# Patient Record
Sex: Female | Born: 1990 | Race: White | Hispanic: No | Marital: Single | Smoking: Current every day smoker
Health system: Southern US, Community
[De-identification: ages and names within clinical notes are randomized; demographics above are authoritative.]

## PROBLEM LIST (undated history)

## (undated) DIAGNOSIS — F410 Panic disorder [episodic paroxysmal anxiety] without agoraphobia: Secondary | ICD-10-CM

## (undated) DIAGNOSIS — O139 Gestational [pregnancy-induced] hypertension without significant proteinuria, unspecified trimester: Secondary | ICD-10-CM

## (undated) DIAGNOSIS — F419 Anxiety disorder, unspecified: Secondary | ICD-10-CM

## (undated) DIAGNOSIS — R55 Syncope and collapse: Secondary | ICD-10-CM

## (undated) DIAGNOSIS — R519 Headache, unspecified: Secondary | ICD-10-CM

## (undated) DIAGNOSIS — S82853A Displaced trimalleolar fracture of unspecified lower leg, initial encounter for closed fracture: Secondary | ICD-10-CM

## (undated) DIAGNOSIS — G932 Benign intracranial hypertension: Secondary | ICD-10-CM

## (undated) DIAGNOSIS — F32A Depression, unspecified: Secondary | ICD-10-CM

## (undated) DIAGNOSIS — L732 Hidradenitis suppurativa: Secondary | ICD-10-CM

## (undated) DIAGNOSIS — O24419 Gestational diabetes mellitus in pregnancy, unspecified control: Secondary | ICD-10-CM

## (undated) DIAGNOSIS — B009 Herpesviral infection, unspecified: Secondary | ICD-10-CM

## (undated) HISTORY — DX: Depression, unspecified: F32.A

## (undated) HISTORY — DX: Anxiety disorder, unspecified: F41.9

## (undated) HISTORY — PX: LUMBAR PUNCTURE: SHX1985

## (undated) HISTORY — DX: Headache, unspecified: R51.9

## (undated) HISTORY — DX: Hidradenitis suppurativa: L73.2

---
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
## 2011-03-28 NOTE — Discharge Summary (Signed)
[name], [name]             ACCOUNT NO.:  [id number]   MEDICAL RECORD NO.:  [id number]          PATIENT TYPE:  INP   LOCATION:  6123                         FACILITY:  [facility]   PHYSICIAN:  Dyann Ruddle, MDDATE OF BIRTH:  29-Mar-1991   DATE OF ADMISSION:  06/26/2008  DATE OF DISCHARGE:  06/29/2008                               DISCHARGE SUMMARY   REASON FOR HOSPITALIZATION:  A 20 year old female with a history of  pseudotumor cerebri with mononucleosis and worsening abdominal pain.   SIGNIFICANT FINDINGS:  Included, CBC on admission that was 2.6, 11.4,  33.5, platelets were 100, neutrophils were 48%, lymphocytes were 41%,  and ANC was 1.2.  She had a PT, which was 16.2 seconds.  INR, which was  1.3 seconds.  A PTT which was 43 seconds.  She had a complete metabolic  panel, which was within normal limits except for AST of 52 and ALT of  45.  She had a urinalysis, which showed 30 proteins, trace leukocytes,  and 0-2 white blood cells per high-powered field.  Her urine pregnancy  test was negative.  She had a CT of the abdomen, which showed  splenomegaly with acute upper pole-shaped infarct.  No hemorrhage or  fluid.  She also had a CT of the pelvis, which showed no acute findings.  Repeat CBC was 2.5, 11.4, 33.3 with platelets of 109, neutrophils were  49%, and lymphocytes were 41%.  Of note [name], also known as [name],  had a slow improvement of her pain throughout her admission.  Though, it  is not completely resolved on discharge.  She developed a headache  during the course of her stay and was treated with scheduled  acetaminophen and rest.  She was given a dose of Lasix with moderate  improvement and headache pain.  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  A peripheral smear was also done  that did not reveal hematologic malignancy and LDH was modestly elevated  at  5:45.  A uric acid was normal at 5.1.  Trauma Surgery was consulted  at admission, but her H&H remained stable, as did her serial abdominal  exam.  It was felt that the patient was stable for discharge with a  repeat abdominal ultrasound for 1-2 months.   Her treatment course included IV morphine x2, Zofran, and IV Toradol.  She has been transitioned to oxycodone 5 mg p.o. q. 4-6 h. for pain and  was given acetaminophen as well.  She also received half maintenance IV  fluids during this hospitalization.   OPERATIONS AND PROCEDURES:  CT of the abdomen and pelvis and a chest x-  ray, which was normal.   FINAL DIAGNOSIS:  Splenic infarct  Splenomegaly  Mononucleosis   She will need a followup ultrasound in 1-2 months after discharge to  evaluate her splenomegaly.  She may not participate in contact sports  for 3 months and she is limited to light activity for 3 months.   DISCHARGE MEDICATIONS:  1. Tylenol 650  mg p.o. q.4-6 h. p.r.n. pain or fever.  2. Oxycodone 5 mg one tablet p.o. q.4-6 h. p.r.n. pain.  3. Zofran 4 mg p.o. 3 times a day p.r.n. nausea.   PENDING RESULTS:  Blood cultures which were drawn on June 27, 2008, at  12 p.m., nd H1N1 flu swab PCR and an antiphospholipid antibody.   FOLLOWUP:  Her follow up is with Dr. Elias Else at [facility] at  the [facility] and she is to follow up on July 02, 2008, at 12:15 p.m.   DISCHARGE WEIGHT:  88.6 kg.   DISCHARGE CONDITION:  Stable.   Addendum:  H1N1 not performed  Antiphopholipid antibody negative      Pediatrics Resident      Dyann Ruddle, MD  Electronically Signed    PR/MEDQ  D:  06/29/2008  T:  06/30/2008  Job:  [id number]   cc:   Molly Maduro A. Nicholos Johns, M.D.  Deanna Artis. Sharene Skeans, M.D.

## 2011-03-28 NOTE — Consult Note (Signed)
[name], [name]             ACCOUNT NO.:  [id number]   MEDICAL RECORD NO.:  [id number]          PATIENT TYPE:  OBV   LOCATION:  6123                         FACILITY:  [facility]   PHYSICIAN:  Anselm Pancoast. Weatherly, M.D.DATE OF BIRTH:  1991-02-15   DATE OF CONSULTATION:  06/26/2008  DATE OF DISCHARGE:                                 CONSULTATION   CHIEF COMPLAINT:  Abdominal pain.   HISTORY:  [name] is a 20 year old moderately overweight young  female who was diagnosed with mononucleosis approximately 2 days ago and  wanted the urgent cares.  She is not that well for about a week.  She  has had problems with nausea and vomiting.  I think she was seen, had  gotten muscular aching, generalized systemic symptoms, and she has had  numerous several episodes of very heavy vomiting, but not a lot of  emesis.  Because of increase in pain in the left upper quadrant, she  came to the emergency room today, was seen by the pediatrician, and the  first thing they did was a CT, and this shows kind of a wedge defect in  a markedly enlarged spleen.  There is no fluid outside of the spleen.  The liver is kind of unremarkable, and most likely, this is a kind of  minimal hematoma within this upper pole of an enlarged spleen.  The only  definite trauma that she has had, they have a cat or dog, that is about  10 pounds that did jump up on her yesterday, and she started having more  of these abdominal left upper quadrant pain last evening.   After the CT have been obtained, laboratory studies were obtained, the  white count is 2600 and hematocrit is 33.5.  Serum electrolytes are  normal.  SGOT and SGPT are only very minimally elevated in the 50s, and  her prothrombin time is about 16 seconds.  Her PTT is normal.   PHYSICAL EXAMINATION:  GENERAL:  She is a slightly overweight young  female Caucasian who looks like she does not feel great.  She is not  seriously ill.  VITAL SIGNS:  She did have  temperature of 100.4 on admission, her pulse  is 95.  She is resting comfortably.  ABDOMEN:  There is no evidence of any generalized abdominal pain.  You  can feel the lower edge of spleen about a hand width below the lower  costal margin, it is probably the spleen 4 times or more of normal size.  I think that the history of the trauma is not great, probably more  related to the retching.  I would expect in the dog jumping on her.  I  would think that she ought to be admitted for observation on the  Pediatric Service and treated her appropriately for the mononucleosis.   I have talked with the patient's mother and the patient, and reviewed  the CT with them.  I think that when she is most likely discharged in a  couple days if there is no drop in the hemoglobin and hematocrit and the  lab staying unremarkable,  that she certainly should not be by herself  for the next week or two.  It is about 2 weeks before school starts and  she of course will not be able to just play any type of contact sports,  etc., until things are back to normal.  I think the likelihood of her  having needed emergency surgery for ruptured spleen is low, and the  importance of stressing they follow no contact activities, etc., has  certainly been stressed upon both the patient and her mother.  Mother  does work and will need to make arrangements that she has an adult with  her at least for the first week when she is home and kind of has to find  how to put her on bed rest with basically bedside bathroom privileges  only at this time, and we will watch carefully.  She is going to be  admitted to the Pediatric Service, the trauma surgeons are  aware of her admission, and we will follow along.  She had been taking  Tylenol and ibuprofen.  I would not use the ibuprofen or Advil for the  discomfort, but fortunately she is not having that much discomfort now.  I am not sure of the etiology of this and also it is probably  related to  the mononucleosis.           ______________________________  Anselm Pancoast. Zachery Dakins, M.D.     WJW/MEDQ  D:  06/26/2008  T:  06/27/2008  Job:  [id number]

## 2011-03-31 NOTE — Op Note (Signed)
[name], West                         ACCOUNT NO.:  [id number]   MEDICAL RECORD NO.:  [id number]                   PATIENT TYPE:  OIB   LOCATION:  6152                                 FACILITY:  [facility]   PHYSICIAN:  Deanna Artis. Sharene Skeans, M.D.           DATE OF BIRTH:  02-10-1991   DATE OF PROCEDURE:  09/09/2002  DATE OF DISCHARGE:                                 OPERATIVE REPORT   PROCEDURE:  Lumbar puncture under anesthesia.   CLINICAL NOTE:  The patient is a 20 year old Caucasian young woman who has  had a history of headaches of seven weeks' duration.  The patient was seen  in my office after her physician, Dr. Merri West, had tried a variety of  treatments, which had not provided relief for her.   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  This is not proven by any other serologies.   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  There is a slight local mass effect  upon the cerebellum and also the bone, indicating that this has been a  chronic process and was not of great consequence.  In addition, the  patient's examination was normal with the exception of no venous pulsations  despite sharp disc margins and no obvious sign of papilledema.   The patient has been treated with Topamax 30 mg twice daily without relief.  She also has rescue medicines with naproxen and Darvocet that have not  provided relief for her.  She has been home and not able to attend school  for the last six weeks and is beginning to fall behind because she has no  interest even in doing her home work.   In this setting, lumbar puncture was done to look for the presence of  increased intracranial pressure and also to look for the presence of low-  grade inflammatory condition such as Lyme,  Mycoplasma, and [facility]  spotted fever.  These systemic serologies were negative, as was her  sedimentation rate.   The patient had been seen also by Dr. Kendell West, neurosurgeon at  [facility], who encouraged me to go ahead with a lumbar puncture.   PHYSICAL EXAMINATION:  VITAL SIGNS:  On examination today, blood pressure  124/62, resting pulse 112 (the patient was anxious), respirations 21.  The  patient was afebrile.  HEENT:  No signs of infection.  NECK:  Supple, full range of motion.  No cranial or cervical bruits.  CHEST:  Lungs clear to auscultation.  CARDIAC:  No murmurs.  Pulses normal.  ABDOMEN:  Soft, nontender, bowel sounds normal.  EXTREMITIES:  Well-formed, without edema, cyanosis, alterations in tone, or  tight heel cords.  NEUROLOGIC:  Mental status:  The patient was  awake and alert, unsmiling  although she would smile if I asked her to do so.  She was coherent,  conversant, able to follow commands and speak in complete sentences.  Cranial nerves:  Round, reactive pupils, normal fundi, full visual fields to  double simultaneous stimuli.  Extraocular movements full and conjugate.  OKN  responses equal bilaterally.  Again, she has sharp disc margins.  I did not  see venous pulsations.  Extraocular movements full and conjugate.  OKN  responses equal bilaterally.  Symmetric facial strength, midline tongue and  uvula.  Air conduction greater than bone conduction bilaterally.   Motor examination:  Normal strength, tone, and mass.  Good fine motor  movements.  No pronator drift.  Sensation intact to cold, vibration, and  stereognosis.  Cerebellar examination:  Good finger-to-nose, rapid  repetitive movements.  No tremor, dystaxia, dysmetria.  Gait and station was  normal.  She was able to walk on her heels and toes and perform a tandem  without difficulty.   DESCRIPTION OF PROCEDURE:  The procedure was carried out with the patient  intubated and given Propofol.  She  tolerated this well.  After sterilely  prepping the patient, I inserted a 20-gague three-inch spinal needle into  the subarachnoid space atraumatically.  Opening pressure was 242 mmH2O.  A  total of 23.5 cc of fluid was removed with a closing pressure of 150 mmH2O.  The patient tolerated this procedure well and was allowed to wake up from  the Propofol.   The fluid will be sent for glucose, protein, cell count, and serologies for  Lyme, Mycoplasma, and [facility] spotted fever by PCR if possible.  The  remainder of the fluid will be saved for one week in case there are  unforeseen abnormalities.  We will allow the patient to go home after  observation and will consider use of Diamox once we are certain that she is  not showing signs of post-lumbar puncture headache.                                               Deanna Artis. Sharene Skeans, M.D.    [facility]  D:  09/09/2002  T:  09/09/2002  Job:  [id number]   cc:   Dario Guardian, M.D.

## 2011-03-31 NOTE — Op Note (Signed)
[name], [name]                         ACCOUNT NO.:  [id number]   MEDICAL RECORD NO.:  [id number]                   PATIENT TYPE:  OIB   LOCATION:  NA                                   FACILITY:  [facility]   PHYSICIAN:  Deanna Artis. Sharene Skeans, M.D.           DATE OF BIRTH:  Mar 17, 1991   DATE OF PROCEDURE:  05/11/2004  DATE OF DISCHARGE:                                 OPERATIVE REPORT   INDICATIONS:  Pseudotumor cerebri, 348.2, without papilledema.   BRIEF HISTORY:  The patient has had chronic daily headaches.  She was seen  October 2003.  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  The patient.  The patient had dull, chronic daily headaches.  She was seen  by Dr. Chrissie Noa __________ of [facility], who agreed  with my findings.  Nonetheless, we performed a lumbar puncture under  anesthesia on two occasions, both of which showed evidence of increased  intracranial pressure.  The patient had an MRI of the brain, which showed a  normal venous angiogram.   The patient was treated with Diamox and did not obtain relief.  She was  switched to furosemide, which helped her.  She has been seen by her  ophthalmologist, Dr. Verne Carrow, and still no evidence of optic disc  edema despite the fact that she has chronic daily headaches, for which she  take furosemide 20 mg per day.   The patient again was examined in my office April 17, 2004, and was found to  have normal examination with no signs of papilledema, sharp disc margins,  visual acuity of 20/30 OD, 20/50 OS.   We had the patient seen by Dr. Jolayne Panther at [facility].  He recommended another lumbar puncture.  This was  carried out and showed an opening pressure of 480 mmH2O.  The patient had 64  mL of spinal fluid removed, which dropped her pressure down to 240 mmH2O.  She tolerated  the procedure well.  The procedure was carried out under  anesthesia.   PLAN:  The patient will recover in PACU and be discharged from there.  She  is to be on bed rest today and to get upright only to eat, void, and  eliminate.  She should drink liberal amounts of caffeinated fluids.  She  should contact my office if she gets a severe holocephalic headache upon  standing.                                               Deanna Artis. Sharene Skeans, M.D.    [facility]  D:  05/11/2004  T:  05/11/2004  Job:  [id number]   cc:  Jolayne Panther, M.D.  Dept. of Neurology, Div. Child Neurology  [facility]. [employer].  [facility]  [place], [place] [address]   Pasty Spillers. Maple Hudson, M.D.  [address]  [place]  [place] [address]  Fax: [phone]

## 2011-03-31 NOTE — Op Note (Signed)
   NAMELANIER, [name]                         ACCOUNT NO.:  [id number]   MEDICAL RECORD NO.:  [id number]                   PATIENT TYPE:  OIB   LOCATION:  2899                                 FACILITY:  [facility]   PHYSICIAN:  Deanna Artis. Sharene Skeans, M.D.           DATE OF BIRTH:  1991/05/29   DATE OF PROCEDURE:  10/14/2002  DATE OF DISCHARGE:                                 OPERATIVE REPORT   INDICATIONS:  Pseudotumor cerebri without papilledema, 348.3 and 277.0.   DESCRIPTION OF PROCEDURE:  After the patient had been placed under  anesthesia with Diprivan, the patient was sterilely prepped and draped and  local anesthesia was not placed.  The needle was inserted in the L3-4  interspace at an acute angle and on the second pass, the interspace was  entered.  Opening pressure was 300 mmH2O.  A total of 23.5 cc was obtained  of clear, colorless fluid.  The initial fluid was slightly tinged with blood  but cleared quickly.  Closing pressure was 160 mmH2O.  The patient tolerated  the procedure well.  A decision will need to be made concerning modification  of her treatment given the presence of ongoing elevated intracranial  pressure.                                                 Deanna Artis. Sharene Skeans, M.D.    [facility]  D:  10/14/2002  T:  10/14/2002  Job:  [id number]

## 2011-03-31 NOTE — Discharge Summary (Signed)
[name], [name]                         ACCOUNT NO.:  [id number]   MEDICAL RECORD NO.:  [id number]                   PATIENT TYPE:  OIB   LOCATION:  2899                                 FACILITY:  [facility]   PHYSICIAN:  Deanna Artis. Sharene Skeans, M.D.           DATE OF BIRTH:  1991-10-21   DATE OF ADMISSION:  10/14/2002  DATE OF DISCHARGE:  10/14/2002                                 DISCHARGE SUMMARY   HISTORY OF PRESENT ILLNESS:  The patient is a 20 year old right-handed  Caucasian girl with a diagnosis of pseudotumor cerebri made by lumbar  puncture under anesthesia September 09, 2002, (opening pressure 242).  The  patient has been treated with Dio mox. Dose has been escalated from 250 mg  twice a day to 250 mg four times a day.  Headache is improved but has not  been abolished.   The patient was brought in today to repeat the lumbar puncture to determine  the efficacy of Dio mox given the fact that she has no papilledema by my  examination or by ophthalmology.   REVIEW OF SYSTEMS:  This is positive for an upper respiratory infection,  sore throat.  No fever, rash or anemia.  No other intercurrent infection.  The patient has had some joint pain with a history of JRA.  She wears  glasses.  She has had ongoing headaches in ways which has made it difficult  for her to go to school.  No neurological signs or symptoms.   MEDICATION:  Dio mox 250 mg q.i.d.   ALLERGIES:  No known drug allergies.   FAMILY HISTORY:  This is positive for coronary artery bypass graft in her  father who died of atherosclerotic cardiovascular disease.  Maternal  grandmother has hypertension.   SOCIAL HISTORY:  The patient is in fifth grade.  She has been able to return  to school.  She does not have outside interests.   PHYSICAL EXAMINATION:  VITAL SIGNS:  Temperature 98.5, resting pulse 98,  blood pressure 119/74, height 4 feet 11 inches, weight 126-1/2 pounds.  HEENT:  She has evidence of upper  respiratory infection with nasal drainage.  LUNGS:  Clear.  CARDIOVASCULAR:  No murmurs.  Pulses normal.  ABDOMEN:  Soft and nontender.  Bowel sounds normal.  EXTREMITIES:  Well formed without edema, cyanosis, alterations in tone or  tight heel cords.  NEUROLOGIC:  MENTAL STATUS:  The patient is awake, alert, anxious.  No  dysphagia or dyspraxia.  CRANIAL NERVES:  Round reactive pupils.  Visual  fields full.  Extraocular movements full and conjugate.  Symmetric facial  strength.  Midline tongue and uvula.  MOTOR EXAMINATION:  Normal strength,  tone and mass.  Good fine motor movements, no pronator drift.  Sensation  intact to cold, vibration, stereognosis.  CEREBELLAR EXAMINATION:  Good  finger-to-nose, rapid repetitive movements, _______ dystaxia, dysphagia.  Gait and station was not tested.  Deep tendon reflexes  are symmetric and  diminished.   IMPRESSION:  Pseudotumor cerebri.   PLAN:  Lumbar puncture under anesthesia.  This will be dictated separately.  The procedure went well.  Opening pressure was 300 mm of water.  We will  need to discuss with the parents increasing Dio mox versus switching over to  Lasix.  The patient tolerated the procedure well.  She will go home and be  on bed rest for the next day or so.  We will discontinue her Dio mox for the  next couple of days to make certain that she does not develop a post lumbar  puncture headache.                                                 Deanna Artis. Sharene Skeans, M.D.    [facility]  D:  10/14/2002  T:  10/14/2002  Job:  [id number]   cc:   Dario Guardian, M.D.  [address]  [place]  [place] [address]  Fax: [phone]

## 2011-08-11 LAB — DIFFERENTIAL
Basophils Absolute: 0
Basophils Relative: 1
Eosinophils Absolute: 0.1
Eosinophils Relative: 2
Lymphocytes Relative: 41
Lymphs Abs: 1.1
Monocytes Absolute: 0.2
Monocytes Relative: 8
Neutro Abs: 1.2 — ABNORMAL LOW
Neutrophils Relative %: 48

## 2011-08-11 LAB — URINE MICROSCOPIC-ADD ON

## 2011-08-11 LAB — CBC
HCT: 33.5 — ABNORMAL LOW
Hemoglobin: 11.4 — ABNORMAL LOW
MCHC: 34.2
MCV: 90.8
Platelets: 100 — ABNORMAL LOW
RBC: 3.69 — ABNORMAL LOW
RDW: 13.5
WBC: 2.6 — ABNORMAL LOW

## 2011-08-11 LAB — COMPREHENSIVE METABOLIC PANEL
ALT: 45 — ABNORMAL HIGH
AST: 52 — ABNORMAL HIGH
Albumin: 3.2 — ABNORMAL LOW
Alkaline Phosphatase: 52
BUN: 10
CO2: 24
Calcium: 7.9 — ABNORMAL LOW
Chloride: 105
Creatinine, Ser: 0.66
Glucose, Bld: 82
Potassium: 3.9
Sodium: 136
Total Bilirubin: 1
Total Protein: 5.6 — ABNORMAL LOW

## 2011-08-11 LAB — URINALYSIS, ROUTINE W REFLEX MICROSCOPIC
Bilirubin Urine: NEGATIVE
Bilirubin Urine: NEGATIVE
Glucose, UA: NEGATIVE
Glucose, UA: NEGATIVE
Hgb urine dipstick: NEGATIVE
Hgb urine dipstick: NEGATIVE
Ketones, ur: 15 — AB
Ketones, ur: NEGATIVE
Nitrite: NEGATIVE
Nitrite: NEGATIVE
Protein, ur: 30 — AB
Specific Gravity, Urine: 1.019
Specific Gravity, Urine: >1.046 — ABNORMAL HIGH
Urobilinogen, UA: 1
pH: 5.5
pH: 6

## 2011-08-11 LAB — PROTIME-INR
INR: 1.3
Prothrombin Time: 16.2 — ABNORMAL HIGH

## 2011-08-11 LAB — POCT PREGNANCY, URINE: Preg Test, Ur: NEGATIVE

## 2011-08-11 LAB — APTT: aPTT: 43 — ABNORMAL HIGH

## 2012-10-21 ENCOUNTER — Other Ambulatory Visit: Payer: Self-pay | Admitting: Family Medicine

## 2012-10-21 ENCOUNTER — Ambulatory Visit
Admission: RE | Admit: 2012-10-21 | Discharge: 2012-10-21 | Disposition: A | Discharge status: Home / Self Care | Payer: 59 | Source: Ambulatory Visit | Attending: Family Medicine | Admitting: Family Medicine

## 2012-10-21 DIAGNOSIS — H538 Other visual disturbances: Secondary | ICD-10-CM

## 2012-10-21 DIAGNOSIS — R51 Headache: Secondary | ICD-10-CM

## 2012-10-23 ENCOUNTER — Emergency Department (HOSPITAL_COMMUNITY): Payer: 59

## 2012-10-23 ENCOUNTER — Emergency Department (HOSPITAL_COMMUNITY)
Admission: EM | Admit: 2012-10-23 | Discharge: 2012-10-24 | Disposition: A | Discharge status: Home / Self Care | Payer: 59 | Attending: Emergency Medicine | Admitting: Emergency Medicine

## 2012-10-23 ENCOUNTER — Encounter (HOSPITAL_COMMUNITY): Payer: Self-pay | Admitting: Adult Health

## 2012-10-23 DIAGNOSIS — R51 Headache: Secondary | ICD-10-CM | POA: Insufficient documentation

## 2012-10-23 DIAGNOSIS — R42 Dizziness and giddiness: Secondary | ICD-10-CM | POA: Insufficient documentation

## 2012-10-23 DIAGNOSIS — F17200 Nicotine dependence, unspecified, uncomplicated: Secondary | ICD-10-CM | POA: Insufficient documentation

## 2012-10-23 DIAGNOSIS — G932 Benign intracranial hypertension: Secondary | ICD-10-CM | POA: Insufficient documentation

## 2012-10-23 HISTORY — DX: Benign intracranial hypertension: G93.2

## 2012-10-23 LAB — CBC WITH DIFFERENTIAL/PLATELET
Eosinophils Absolute: 0.2 10*3/uL (ref 0.0–0.7)
HCT: 43.2 % (ref 36.0–46.0)
Hemoglobin: 14.8 g/dL (ref 12.0–15.0)
Lymphs Abs: 2.3 10*3/uL (ref 0.7–4.0)
MCH: 30 pg (ref 26.0–34.0)
MCHC: 34.3 g/dL (ref 30.0–36.0)
Monocytes Absolute: 0.6 10*3/uL (ref 0.1–1.0)
Monocytes Relative: 8 % (ref 3–12)
Neutrophils Relative %: 59 % (ref 43–77)
RBC: 4.94 MIL/uL (ref 3.87–5.11)

## 2012-10-23 NOTE — ED Provider Notes (Signed)
History     CSN: [id number]  Arrival date & time 10/23/12  2053   First MD Initiated Contact with Patient 10/23/12 2306      Chief Complaint  Patient presents with  . Blurred Vision    (Consider location/radiation/quality/duration/timing/severity/associated sxs/prior treatment) HPI 21 year old female presents to emergency apartment complaining of 2 days of global headache, pressure in her shoulders ongoing for last several weeks, and new onset of blurred vision with periods of loss of vision for 10-15 seconds. Patient has history of pseudotumor cerebri. It has been many years since her last LP. She denies being on any medications for her pseudotumor. Patient has been seen by her primary care doctor who recommended emergent neurologic evaluation, but she does not have a clinic appointment until the 18th. She was recommended to come to the emergency department today.  Past Medical History  Diagnosis Date  . Pseudotumor cerebri     No past surgical history on file.  No family history on file.  History  Substance Use Topics  . Smoking status: Current Every Day Smoker -- 0.5 packs/day    Types: Cigarettes  . Smokeless tobacco: Not on file  . Alcohol Use: No    OB History    Grav Para Term Preterm Abortions TAB SAB Ect Mult Living                  Review of Systems  Constitutional: Negative for chills, diaphoresis and fatigue.  Eyes: Positive for visual disturbance.  Neurological: Positive for dizziness and headaches.  All other systems reviewed and are negative.    Allergies  Bactrim  Home Medications   Current Outpatient Rx  Name  Route  Sig  Dispense  Refill  . ACETAMINOPHEN 500 MG PO TABS   Oral   Take 500 mg by mouth every 6 (six) hours as needed. For pain         . NAPROXEN SODIUM 220 MG PO TABS   Oral   Take 220 mg by mouth 2 (two) times daily with a meal.           BP 172/83  Pulse 83  Temp 98.8 F (37.1 C) (Oral)  Resp 18  SpO2 100%  LMP  10/14/2012  Physical Exam  Nursing note and vitals reviewed. Constitutional: She is oriented to person, place, and time. She appears well-developed and well-nourished.  HENT:  Head: Normocephalic and atraumatic.  Right Ear: External ear normal.  Left Ear: External ear normal.  Nose: Nose normal.  Mouth/Throat: Oropharynx is clear and moist.  Eyes: Conjunctivae normal and EOM are normal. Pupils are equal, round, and reactive to light.  Neck: Normal range of motion. Neck supple. No JVD present. No tracheal deviation present. No thyromegaly present.  Cardiovascular: Normal rate, regular rhythm, normal heart sounds and intact distal pulses.  Exam reveals no gallop and no friction rub.   No murmur heard. Pulmonary/Chest: Effort normal and breath sounds normal. No stridor. No respiratory distress. She has no wheezes. She has no rales. She exhibits no tenderness.  Abdominal: Soft. Bowel sounds are normal. She exhibits no distension and no mass. There is no tenderness. There is no rebound and no guarding.  Musculoskeletal: Normal range of motion. She exhibits no edema and no tenderness.  Lymphadenopathy:    She has no cervical adenopathy.  Neurological: She is alert and oriented to person, place, and time. She has normal reflexes. No cranial nerve deficit. She exhibits normal muscle tone. Coordination normal.  Skin:  Skin is warm and dry. No rash noted. No erythema. No pallor.  Psychiatric: She has a normal mood and affect. Her behavior is normal. Judgment and thought content normal.    ED Course  LUMBAR PUNCTURE Date/Time: 10/24/2012 12:50 AM Performed by: Olivia Mackie Authorized by: Olivia Mackie Consent: Verbal consent obtained. Written consent obtained. Risks and benefits: risks, benefits and alternatives were discussed Consent given by: patient and parent Patient understanding: patient states understanding of the procedure being performed Patient consent: the patient's understanding of  the procedure matches consent given Procedure consent: procedure consent matches procedure scheduled Relevant documents: relevant documents present and verified Test results: test results available and properly labeled Site marked: the operative site was marked Imaging studies: imaging studies available Patient identity confirmed: verbally with patient Time out: Immediately prior to procedure a "time out" was called to verify the correct patient, procedure, equipment, support staff and site/side marked as required. Indications: HA, h/o pseudotumor cerebri. Anesthesia: local infiltration Local anesthetic: lidocaine 1% without epinephrine Anesthetic total: 5 ml Patient sedated: no Preparation: Patient was prepped and draped in the usual sterile fashion. Lumbar space: L3-L4 interspace Patient's position: right lateral decubitus Needle gauge: 22 Needle length: 1.5 in Number of attempts: 1 Opening pressure: 35 cm H2O Closing pressure: 20 cm H2O Fluid appearance: clear Tubes of fluid: 3 Total volume: 12 ml Post-procedure: site cleaned and adhesive bandage applied Patient tolerance: Patient tolerated the procedure well with no immediate complications. Comments: Patient had mild nausea during procedure, no syncope, no vomiting.  She is feeling better post procedure.   (including critical care time)   Labs Reviewed  CBC WITH DIFFERENTIAL  BASIC METABOLIC PANEL  PROTIME-INR  GRAM STAIN  PROTEIN AND GLUCOSE, CSF  CSF CELL COUNT WITH DIFFERENTIAL  CSF CELL COUNT WITH DIFFERENTIAL  CSF CULTURE   Ct Head Wo Contrast  10/23/2012  *RADIOLOGY REPORT*  Clinical Data: 2-day history of blurry vision.  Headache.  CT HEAD WITHOUT CONTRAST  Technique:  Contiguous axial images were obtained from the base of the skull through the vertex without contrast.  Comparison: 10/21/2012.  Findings: No  midline shift, hydrocephalus, hemorrhage.  No territorial ischemia or acute infarction.  CSF attenuation  cystic lesion in the right posterior fossa is similar, most compatible with an arachnoid cyst.  Paranasal sinuses appear normal. Visualized globes and orbits appear normal.  IMPRESSION: No acute intracranial abnormality.  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   Original Report Authenticated By: Andreas Newport, M.D.      1. Pseudotumor cerebri       MDM  21 year old female with history of pseudotumor cerebri. Plan for therapeutic LP, and followup with neurology as scheduled.   2:10 AM Pt feeling much better.  LP completed.  D/w neurohospitalist Dr Luan Pulling, who recommends starting diamox and f/u as scheduled.  Pt with remote h/o allergy to bactrim, unsure if it was stomach upset or mild rash.  Will give diamox, pt and family cautioned for possible cross reactivity.         Olivia Mackie, MD 10/24/12 [phone]

## 2012-10-23 NOTE — ED Notes (Addendum)
Presents with 2 day history of blurred vision,  "curtain vision, that goes completely black"  And floaters. The loss of vision only lasts approx 10-15 sec. Pt denies loss of consciousness at these times she loses sight. Hx of psuedotumor cerebi , has had 3 LP to get fluid off. C/o headache.  Alert and oriented.  PERRLA

## 2012-10-24 LAB — GRAM STAIN

## 2012-10-24 LAB — CSF CELL COUNT WITH DIFFERENTIAL
RBC Count, CSF: 0 /mm3
Tube #: 1
WBC, CSF: 1 /mm3 (ref 0–5)
WBC, CSF: 2 /mm3 (ref 0–5)

## 2012-10-24 LAB — PROTEIN AND GLUCOSE, CSF
Glucose, CSF: 64 mg/dL (ref 43–76)
Total  Protein, CSF: 16 mg/dL (ref 15–45)

## 2012-10-24 LAB — BASIC METABOLIC PANEL
BUN: 12 mg/dL (ref 6–23)
Chloride: 100 mEq/L (ref 96–112)
Creatinine, Ser: 0.71 mg/dL (ref 0.50–1.10)
GFR calc non Af Amer: >90 mL/min (ref >90)
Glucose, Bld: 82 mg/dL (ref 70–99)
Potassium: 4.5 mEq/L (ref 3.5–5.1)

## 2012-10-24 MED ORDER — ACETAZOLAMIDE ER 500 MG PO CP12: 500.0000 mg | ORAL_CAPSULE | Freq: Two times a day (BID) | ORAL | Status: DC

## 2012-10-24 MED ORDER — ONDANSETRON 4 MG PO TBDP
4.0000 mg | ORAL_TABLET | ORAL | Status: AC
  Administered 2012-10-24: 4 mg via ORAL
  Filled 2012-10-24: qty 1

## 2013-03-26 ENCOUNTER — Ambulatory Visit: Payer: Self-pay | Admitting: Diagnostic Neuroimaging

## 2014-04-01 ENCOUNTER — Ambulatory Visit (INDEPENDENT_AMBULATORY_CARE_PROVIDER_SITE_OTHER): Payer: 59 | Admitting: Psychiatry

## 2014-04-01 DIAGNOSIS — F4310 Post-traumatic stress disorder, unspecified: Secondary | ICD-10-CM

## 2014-04-22 ENCOUNTER — Other Ambulatory Visit (HOSPITAL_COMMUNITY)
Admission: RE | Admit: 2014-04-22 | Discharge: 2014-04-22 | Disposition: A | Discharge status: Home / Self Care | Payer: 59 | Source: Ambulatory Visit | Attending: Obstetrics and Gynecology | Admitting: Obstetrics and Gynecology

## 2014-04-22 ENCOUNTER — Other Ambulatory Visit: Payer: Self-pay | Admitting: Obstetrics and Gynecology

## 2014-04-22 DIAGNOSIS — Z01419 Encounter for gynecological examination (general) (routine) without abnormal findings: Secondary | ICD-10-CM | POA: Insufficient documentation

## 2014-04-22 DIAGNOSIS — Z113 Encounter for screening for infections with a predominantly sexual mode of transmission: Secondary | ICD-10-CM | POA: Insufficient documentation

## 2014-04-28 LAB — CYTOLOGY - PAP

## 2014-12-01 ENCOUNTER — Other Ambulatory Visit: Payer: Self-pay | Admitting: Family Medicine

## 2014-12-01 DIAGNOSIS — R1032 Left lower quadrant pain: Secondary | ICD-10-CM

## 2014-12-03 ENCOUNTER — Ambulatory Visit
Admission: RE | Admit: 2014-12-03 | Discharge: 2014-12-03 | Disposition: A | Discharge status: Home / Self Care | Payer: 59 | Source: Ambulatory Visit | Attending: Family Medicine | Admitting: Family Medicine

## 2014-12-03 DIAGNOSIS — R1032 Left lower quadrant pain: Secondary | ICD-10-CM

## 2015-04-23 ENCOUNTER — Other Ambulatory Visit: Payer: Self-pay | Admitting: Obstetrics and Gynecology

## 2015-04-23 ENCOUNTER — Other Ambulatory Visit (HOSPITAL_COMMUNITY)
Admission: RE | Admit: 2015-04-23 | Discharge: 2015-04-23 | Disposition: A | Discharge status: Home / Self Care | Payer: 59 | Source: Ambulatory Visit | Attending: Obstetrics and Gynecology | Admitting: Obstetrics and Gynecology

## 2015-04-23 DIAGNOSIS — Z01419 Encounter for gynecological examination (general) (routine) without abnormal findings: Secondary | ICD-10-CM | POA: Diagnosis present

## 2015-04-23 DIAGNOSIS — Z113 Encounter for screening for infections with a predominantly sexual mode of transmission: Secondary | ICD-10-CM | POA: Insufficient documentation

## 2015-04-27 LAB — CYTOLOGY - PAP

## 2015-06-06 IMAGING — US US TRANSVAGINAL NON-OB
1 series · 14 of 25 positions shown · non-contrast
Comparison: None

CLINICAL DATA: Left lower quadrant pain.

EXAM:
TRANSABDOMINAL AND TRANSVAGINAL ULTRASOUND OF PELVIS
TECHNIQUE: Both transabdominal and transvaginal ultrasound examinations of the
pelvis were performed. Transabdominal technique was performed for
global imaging of the pelvis including uterus, ovaries, adnexal
regions, and pelvic cul-de-sac. It was necessary to proceed with
endovaginal exam following the transabdominal exam to visualize the
uterus and ovaries..

[Series 1: us transvaginal non-ob · 0.17mm/px · 14 of 57 slices shown]
[im 1/57]
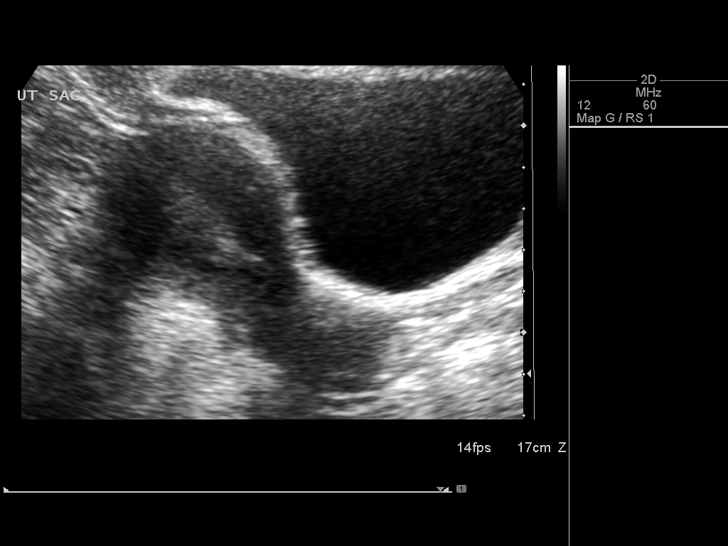
[im 5/57]
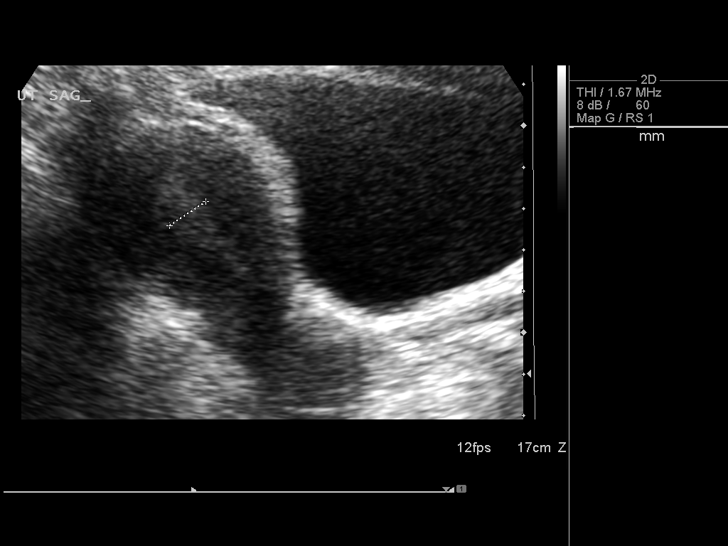
[im 10/57]
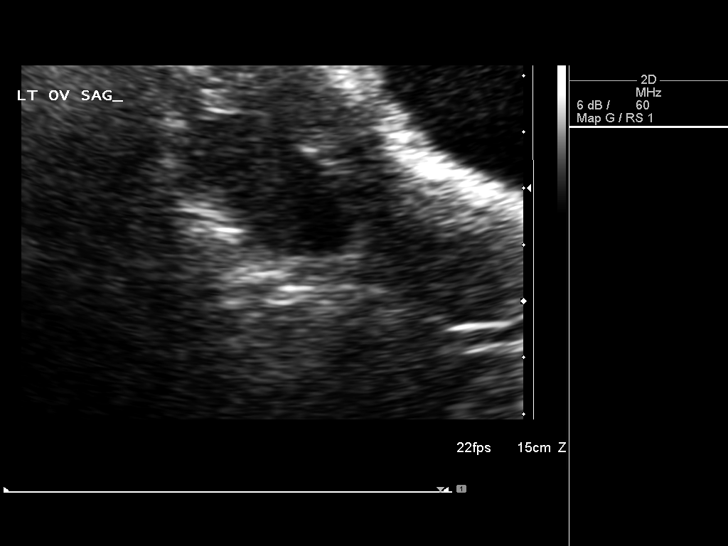
[im 15/57]
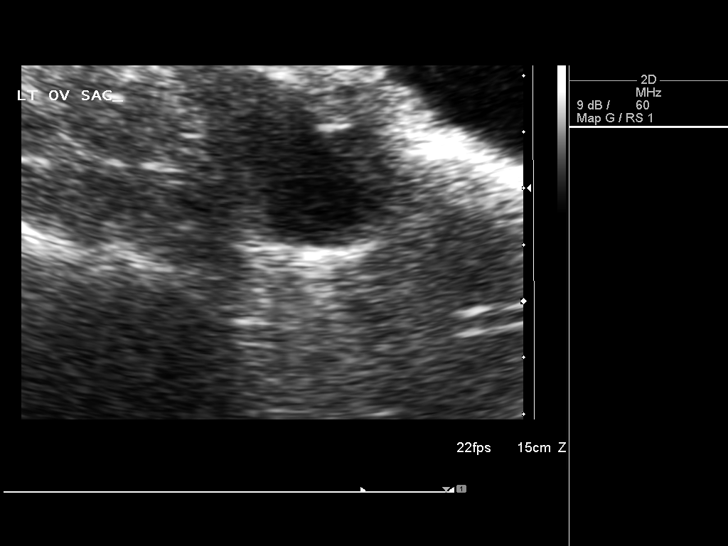
[im 19/57]
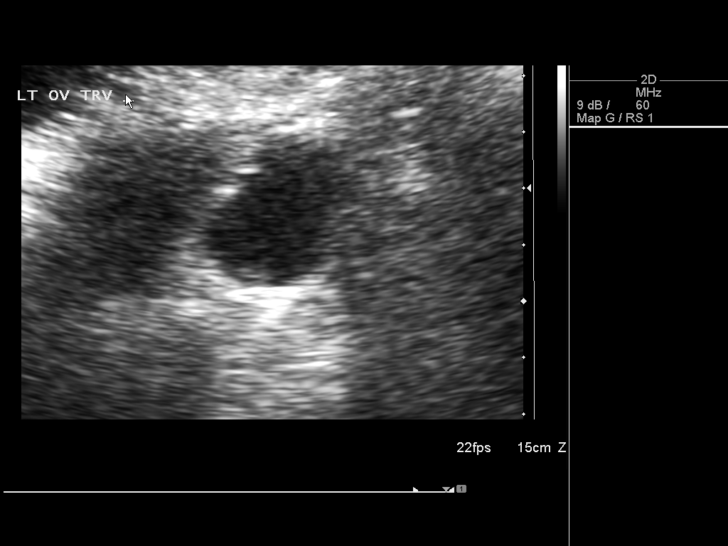
[im 22/57]
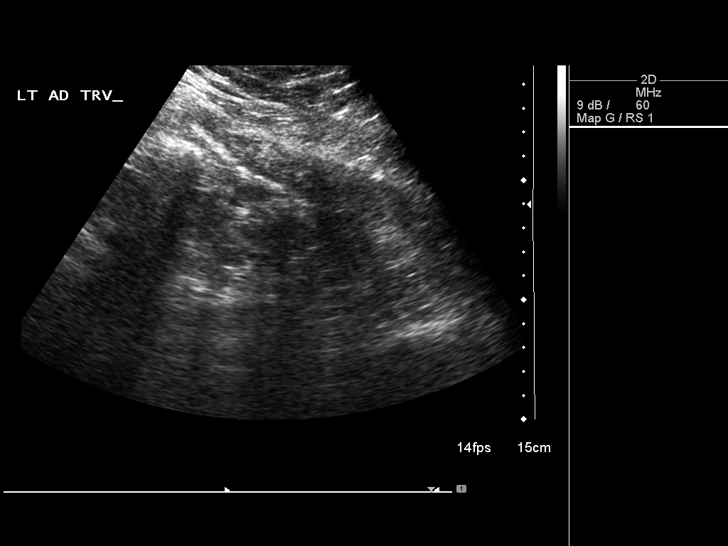
[im 26/57]
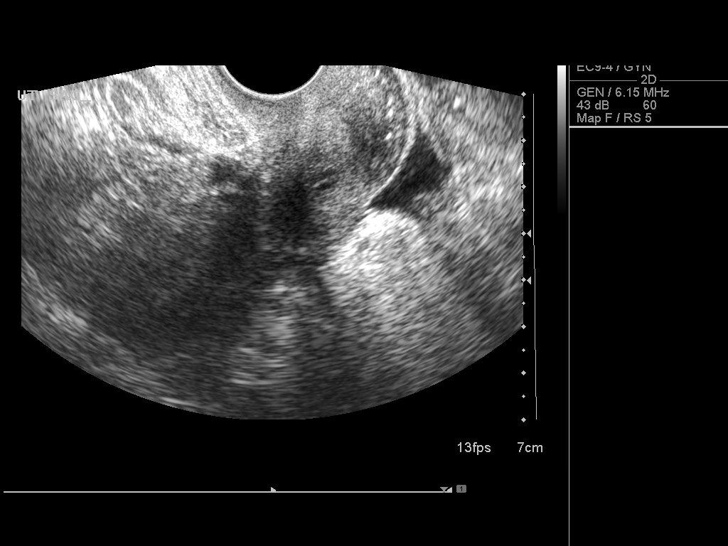
[im 31/57]
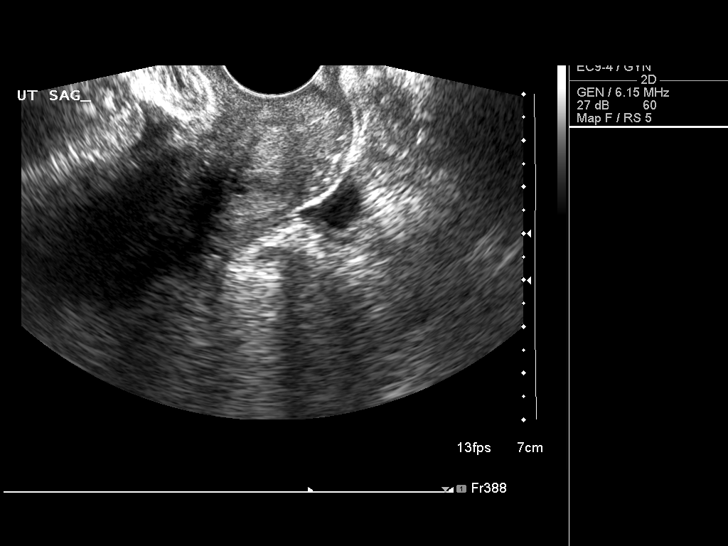
[im 36/57]
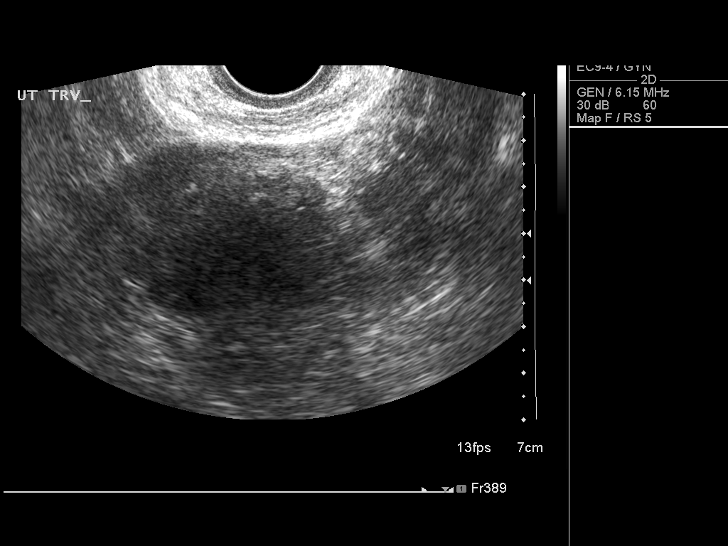
[im 38/57]
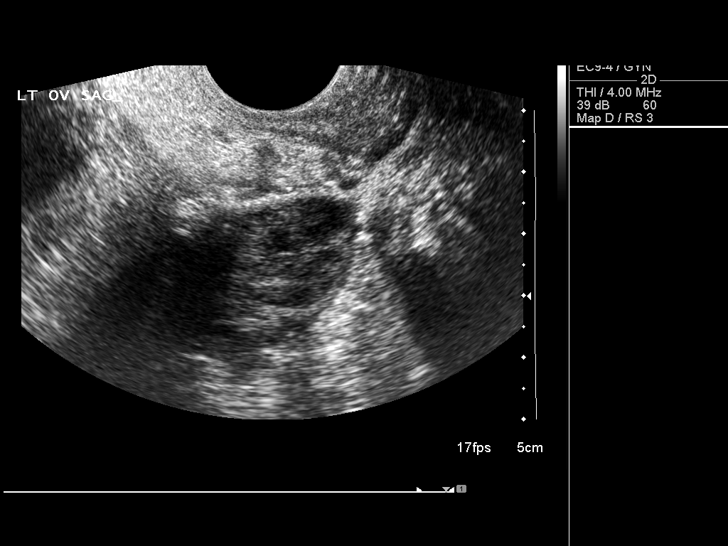
[im 43/57]
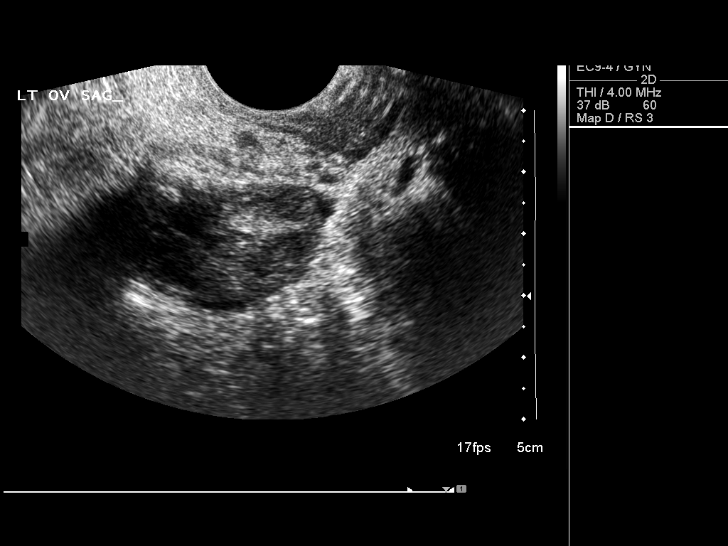
[im 47/57]
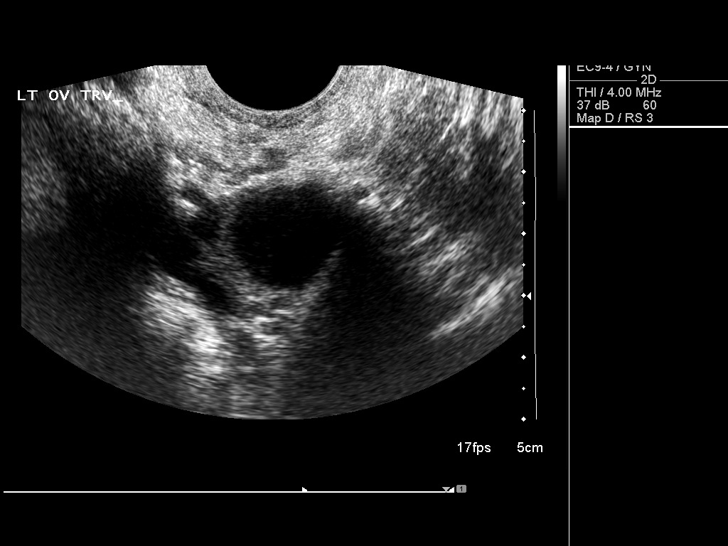
[im 52/57]
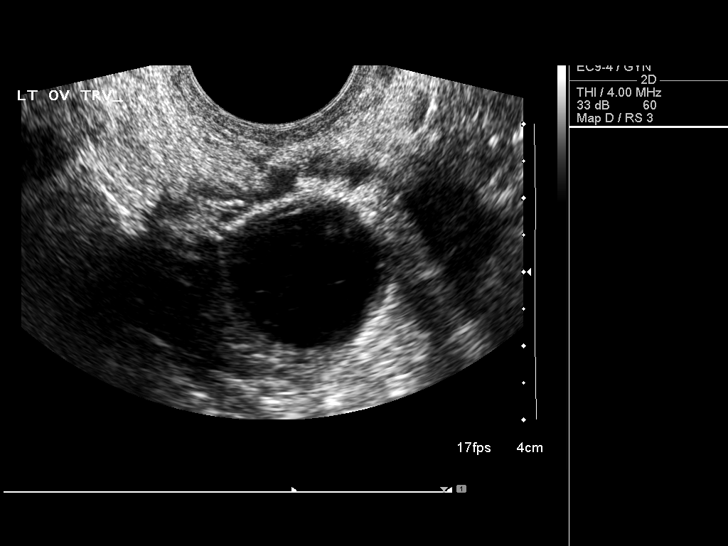
[im 57/57]
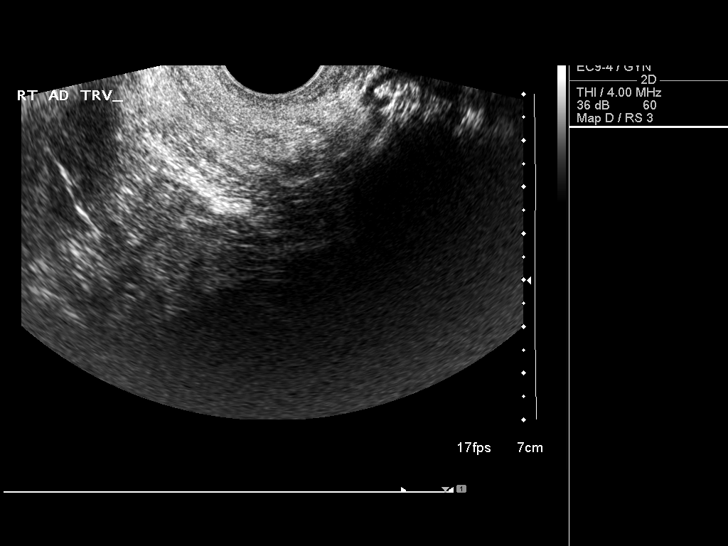

[14 of 25 positions shown; findings below may reference images not displayed]

FINDINGS: Uterus

Measurements: 8.24 x 4.1 x 5.4 cm. No fibroids or other mass
visualized.

Endometrium

Thickness: 13.3 mm.  No focal abnormality visualized.

Right ovary

Not visualized.

Left ovary

Measurements: 3.9 x 2.4 x 2.0 cm. 3.1 x 2.1 x 1.9 cm simple cyst.

Other findings

Trace free pelvic fluid.  This is nonspecific.
IMPRESSION: 3.1 cm simple cyst left ovary with trace free pelvic fluid. Exam
otherwise negative.

## 2016-01-01 ENCOUNTER — Encounter (HOSPITAL_COMMUNITY): Payer: Self-pay | Admitting: *Deleted

## 2016-01-01 ENCOUNTER — Emergency Department (HOSPITAL_COMMUNITY): Payer: 59

## 2016-01-01 ENCOUNTER — Emergency Department (HOSPITAL_COMMUNITY)
Admission: EM | Admit: 2016-01-01 | Discharge: 2016-01-01 | Disposition: A | Discharge status: Home / Self Care | Payer: 59 | Attending: Emergency Medicine | Admitting: Emergency Medicine

## 2016-01-01 DIAGNOSIS — F17210 Nicotine dependence, cigarettes, uncomplicated: Secondary | ICD-10-CM | POA: Diagnosis not present

## 2016-01-01 DIAGNOSIS — G932 Benign intracranial hypertension: Secondary | ICD-10-CM | POA: Diagnosis not present

## 2016-01-01 DIAGNOSIS — Z880 Allergy status to penicillin: Secondary | ICD-10-CM | POA: Insufficient documentation

## 2016-01-01 DIAGNOSIS — Z791 Long term (current) use of non-steroidal anti-inflammatories (NSAID): Secondary | ICD-10-CM | POA: Insufficient documentation

## 2016-01-01 DIAGNOSIS — R55 Syncope and collapse: Secondary | ICD-10-CM | POA: Diagnosis present

## 2016-01-01 LAB — URINALYSIS, ROUTINE W REFLEX MICROSCOPIC
BILIRUBIN URINE: NEGATIVE
GLUCOSE, UA: NEGATIVE mg/dL
HGB URINE DIPSTICK: NEGATIVE
KETONES UR: NEGATIVE mg/dL
LEUKOCYTES UA: NEGATIVE
Nitrite: NEGATIVE
PH: 6 (ref 5.0–8.0)
PROTEIN: NEGATIVE mg/dL
Specific Gravity, Urine: 1.027 (ref 1.005–1.030)

## 2016-01-01 LAB — PROTEIN, CSF: TOTAL PROTEIN, CSF: 18 mg/dL (ref 15–45)

## 2016-01-01 LAB — BASIC METABOLIC PANEL
Anion gap: 11 (ref 5–15)
BUN: 10 mg/dL (ref 6–20)
CHLORIDE: 104 mmol/L (ref 101–111)
CO2: 25 mmol/L (ref 22–32)
CREATININE: 0.82 mg/dL (ref 0.44–1.00)
Calcium: 9.4 mg/dL (ref 8.9–10.3)
GFR calc Af Amer: >60 mL/min (ref >60)
GFR calc non Af Amer: >60 mL/min (ref >60)
GLUCOSE: 95 mg/dL (ref 65–99)
POTASSIUM: 4 mmol/L (ref 3.5–5.1)
Sodium: 140 mmol/L (ref 135–145)

## 2016-01-01 LAB — CSF CELL COUNT WITH DIFFERENTIAL
RBC COUNT CSF: 4 /mm3 — AB
Tube #: 1
WBC, CSF: 1 /mm3 (ref 0–5)

## 2016-01-01 LAB — CBG MONITORING, ED: Glucose-Capillary: 93 mg/dL (ref 65–99)

## 2016-01-01 LAB — CBC
HEMATOCRIT: 44.7 % (ref 36.0–46.0)
Hemoglobin: 15.4 g/dL — ABNORMAL HIGH (ref 12.0–15.0)
MCH: 32.3 pg (ref 26.0–34.0)
MCHC: 34.5 g/dL (ref 30.0–36.0)
MCV: 93.7 fL (ref 78.0–100.0)
PLATELETS: 186 10*3/uL (ref 150–400)
RBC: 4.77 MIL/uL (ref 3.87–5.11)
RDW: 12.7 % (ref 11.5–15.5)
WBC: 5.2 10*3/uL (ref 4.0–10.5)

## 2016-01-01 LAB — GRAM STAIN

## 2016-01-01 LAB — GLUCOSE, CSF: Glucose, CSF: 59 mg/dL (ref 40–70)

## 2016-01-01 MED ORDER — SODIUM CHLORIDE 0.9 % IV BOLUS (SEPSIS)
500.0000 mL | Freq: Once | INTRAVENOUS | Status: AC
  Administered 2016-01-01: 500 mL via INTRAVENOUS

## 2016-01-01 NOTE — ED Notes (Signed)
Abigail PA attemped LP x2 without success. Sterile technique was used.

## 2016-01-01 NOTE — Discharge Instructions (Signed)
Idiopathic Intracranial Hypertension Idiopathic intracranial hypertension (IIH) is a neurologic disorder that leads to increased pressure around your brain. It can cause vision loss and blindness if left untreated. RISK FACTORS IIH is most common in very overweight (obese) women of childbearing age. SIGNS AND SYMPTOMS  Symptoms of IIH include:  Headache.  Feeling of sickness in your stomach (nausea).  Vomiting.  A "rushing of water" sound within your ears (pulsatile tinnitus).  Double vision. DIAGNOSIS  Idiopathic intracranial hypertension is diagnosed with the aid of different exams:  Brain scans such as:  CT.  MRI.  MRV.  Diagnostic lumbar puncture. This procedure can determine if there is too much spinal fluid within the central nervous system. Too much spinal fluid can increase intracranial pressure.  A thorough eye exam will be done to look for swelling within the eyes. Visual field testing will also be done to see if any damage has occurred to nerves in the eyes. TREATMENT  Treatment of idiopathic intracranial hypertension is based on symptoms. Common treatments include:  Lumbar puncture to remove excess spinal fluid.  Medicine.  Surgery. HOME CARE INSTRUCTIONS The most important thing anyone can do to improve this condition is lose weight if they are overweight.  SEEK MEDICAL CARE IF:  You have changes in vision.  You have double vision.  You have loss of color vision. SEEK IMMEDIATE MEDICAL CARE IF:   Your headaches get worse rather than better.  Nausea or vomiting or both continue after treatment.  Your vision does not improve or gets worse after treatment. MAKE SURE YOU:  Understand these instructions.  Will watch your condition.  Will get help right away if you are not doing well or get worse.   This information is not intended to replace advice given to you by your health care provider. Make sure you discuss any questions you have with your  health care provider.   Document Released: 01/08/2002 Document Revised: 11/04/2013 Document Reviewed: 07/07/2013 Elsevier Interactive Patient Education 2016 [employer] Lumbar Puncture A lumbar puncture, or spinal tap, is a procedure in which a small amount of the fluid that surrounds the brain and spinal cord is removed and examined. The fluid is called the cerebrospinal fluid. This procedure may be done to:   Help diagnose various problems, such as meningitis, encephalitis, multiple sclerosis, and AIDS.   Remove fluid and relieve pressure that occurs with certain types of headaches.   Look for bleeding within the brain and spinal cord areas (central nervous system).   Place medicine into the spinal fluid.  LET [facility] CARE PROVIDER KNOW ABOUT:  Any allergies you have.  All medicines you are taking, including vitamins, herbs, eye drops, creams, and over-the-counter medicines.  Previous problems you or members of your family have had with the use of anesthetics.  Any blood disorders you have.  Previous surgeries you have had.  Medical conditions you have. RISKS AND COMPLICATIONS Generally, this is a safe procedure. However, as with any procedure, complications can occur. Possible complications include:   Spinal headache. This is a severe headache that occurs when there is a leak of spinal fluid. A spinal headache causes discomfort but is not dangerous. If it persists, another procedure may be done to treat the headache.  Bleeding. This most often occurs in people with bleeding disorders. These are disorders in which the blood does not clot normally.   Infection at the insertion site that can spread to the bone or spinal fluid.  Formation of  a spinal cord tumor (rare).  Brain herniation or movement of the brain into the spinal cord (rare).  Inability to move (extremely rare). BEFORE THE PROCEDURE  You may have blood tests done. These tests can help tell how well  your kidneys and liver are working. They can also show how well your blood clots.   If you take blood thinners (anticoagulant medicine), ask your health care provider if and when you should stop taking them.   Your health care provider may order a CT scan of your brain.  Make arrangements for someone to drive you home after the procedure.  PROCEDURE  You will be positioned so that the spaces between the bones of the spine (vertebrae) are as wide as possible. This will make it easier to pass the needle into the spinal canal.  Depending on your age and size, you may lie on your side, curled up with your knees under your chin. Or, you may sit with your head resting on a pillow that is placed at waist level.  The skin covering the lower back (or lumbar region) will be cleaned.   The skin may be numbed with medicine.  You may be given pain medicine or a medicine to help you relax (sedative).  A small needle will be inserted in the skin until it enters the space that contains the spinal fluid. The needle will not enter the spinal cord.   The spinal fluid will be collected into tubes.   The needle will be withdrawn, and a bandage will be placed on the site.  AFTER THE PROCEDURE  You will remain lying down for 1 hour or for as long as your health care provider suggests.   The spinal fluid will be sent to a laboratory to be examined. The results of the examination may be available before you go home.  A test, called a culture, may be taken of the spinal fluid if your health care provider thinks you have an infection. If cultures were taken for exam, the results will usually be available in a couple of days.    This information is not intended to replace advice given to you by your health care provider. Make sure you discuss any questions you have with your health care provider.   Document Released: 10/27/2000 Document Revised: 08/20/2013 Document Reviewed: 07/07/2013 Elsevier  Interactive Patient Education [employer]

## 2016-01-01 NOTE — ED Provider Notes (Signed)
CSN: [id number]     Arrival date & time 01/01/16  5621 History   First MD Initiated Contact with Patient 01/01/16 1106     Chief Complaint  Patient presents with  . Near Syncope     (Consider location/radiation/quality/duration/timing/severity/associated sxs/prior Treatment) Patient is a 25 y.o. female presenting with headaches. The history is provided by the patient (Patient states that she has a history of pseudotumor cerebri. She had an LP done at least 4 times since she was a child. Last time was 4 years ago about).  Headache Pain location:  Generalized Quality:  Dull Radiates to:  Does not radiate Severity currently:  6/10 Onset quality:  Gradual Timing:  Constant Associated symptoms: no abdominal pain, no back pain, no congestion, no cough, no diarrhea, no fatigue, no seizures and no sinus pressure     Past Medical History  Diagnosis Date  . Pseudotumor cerebri    Past Surgical History  Procedure Laterality Date  . Spinal tap      x4   Family History  Problem Relation Age of Onset  . Diabetes    . Heart failure    . Breast cancer     Social History  Substance Use Topics  . Smoking status: Current Every Day Smoker -- 1.00 packs/day    Types: Cigarettes  . Smokeless tobacco: None  . Alcohol Use: No   OB History    No data available     Review of Systems  Constitutional: Negative for appetite change and fatigue.  HENT: Negative for congestion, ear discharge and sinus pressure.   Eyes: Negative for discharge.  Respiratory: Negative for cough.   Cardiovascular: Negative for chest pain.  Gastrointestinal: Negative for abdominal pain and diarrhea.  Genitourinary: Negative for frequency and hematuria.  Musculoskeletal: Negative for back pain.  Skin: Negative for rash.  Neurological: Positive for headaches. Negative for seizures.  Psychiatric/Behavioral: Negative for hallucinations.      Allergies  Amoxicillin and Bactrim  Home Medications   Prior to  Admission medications   Medication Sig Start Date End Date Taking? Authorizing Provider  acetaminophen (TYLENOL) 500 MG tablet Take 500 mg by mouth every 6 (six) hours as needed. For pain   Yes Historical Provider, MD  naproxen sodium (ANAPROX) 220 MG tablet Take 220 mg by mouth 2 (two) times daily with a meal.   Yes Historical Provider, MD   BP 111/65 mmHg  Pulse 62  Temp(Src) 98.4 F (36.9 C) (Oral)  Resp 16  Ht  (1.626 m)  Wt 221 lb (100.245 kg)  BMI 37.92 kg/m2  SpO2 99%  LMP 12/18/2015 Physical Exam  Constitutional: She is oriented to person, place, and time. She appears well-developed.  HENT:  Head: Normocephalic.  Eyes: Conjunctivae and EOM are normal. No scleral icterus.  Neck: Neck supple. No thyromegaly present.  Cardiovascular: Normal rate and regular rhythm.  Exam reveals no gallop and no friction rub.   No murmur heard. Pulmonary/Chest: No stridor. She has no wheezes. She has no rales. She exhibits no tenderness.  Abdominal: She exhibits no distension. There is no tenderness. There is no rebound.  Musculoskeletal: Normal range of motion. She exhibits no edema.  Lymphadenopathy:    She has no cervical adenopathy.  Neurological: She is oriented to person, place, and time. She exhibits normal muscle tone. Coordination normal.  Skin: No rash noted. No erythema.  Psychiatric: She has a normal mood and affect. Her behavior is normal.    ED Course  Procedures (  including critical care time) Labs Review Labs Reviewed  CBC - Abnormal; Notable for the following:    Hemoglobin 15.4 (*)    All other components within normal limits  URINALYSIS, ROUTINE W REFLEX MICROSCOPIC (NOT AT [facility]) - Abnormal; Notable for the following:    APPearance CLOUDY (*)    All other components within normal limits  GRAM STAIN  BASIC METABOLIC PANEL  GLUCOSE, CSF  PROTEIN, CSF  CSF CELL COUNT WITH DIFFERENTIAL  CBG MONITORING, ED    Imaging Review Ct Head Wo Contrast  01/01/2016   CLINICAL DATA:  Increasing headaches for the past several weeks. Syncopal episode last week. Progressive visual blurring for the past few weeks. History of pseudotumor cerebri. EXAM: CT HEAD WITHOUT CONTRAST TECHNIQUE: Contiguous axial images were obtained from the base of the skull through the vertex without intravenous contrast. COMPARISON:  10/23/2012. FINDINGS: And arachnoid cyst in the posterior aspect of the posterior fossa on the left is unchanged. Otherwise, normal appearing cerebral hemispheres and posterior fossa structures. Normal size and position of the ventricles. No intracranial hemorrhage, mass lesion or CT evidence of acute infarction. Unremarkable bones and included paranasal sinuses. Incidentally noted small left maxillary sinus retention cyst. IMPRESSION: No acute abnormality. Electronically Signed   By: Beckie Salts M.D.   On: 01/01/2016 11:42   I have personally reviewed and evaluated these images and lab results as part of my medical decision-making.   EKG Interpretation None      MDM   Final diagnoses:  Pseudotumor cerebri    Patient with a headache and history of pseudotumor cerebri. Neurology consult would and they recommended getting an LP. Patient had an LP and her opening pressure was 29. 15 mL were taken out and the closing pressure was 18. Patient will be referred to neurology for follow-up    Bethann Berkshire, MD 01/04/16 0003

## 2016-01-01 NOTE — ED Provider Notes (Signed)
.  Lumbar Puncture Date/Time: 01/01/2016 5:00 PM Performed by: Arthor Captain Authorized by: Arthor Captain Consent: Verbal consent obtained. Written consent obtained. Risks and benefits: risks, benefits and alternatives were discussed Consent given by: patient Patient understanding: patient states understanding of the procedure being performed Patient consent: the patient's understanding of the procedure matches consent given Procedure consent: procedure consent matches procedure scheduled Relevant documents: relevant documents present and verified Site marked: the operative site was marked Patient identity confirmed: provided demographic data Time out: Immediately prior to procedure a "time out" was called to verify the correct patient, procedure, equipment, support staff and site/side marked as required. Indications: therapeutic tap. Anesthesia: local infiltration Local anesthetic: lidocaine 1% without epinephrine Anesthetic total: 5 ml Patient sedated: no Preparation: Patient was prepped and draped in the usual sterile fashion. Lumbar space: L4-L5 interspace Patient's position: left lateral decubitus Needle gauge: 18 Needle type: spinal needle - Quincke tip Needle length: 3.5 in Number of attempts: 2 Post-procedure: pressure dressing applied Patient tolerance: Patient tolerated the procedure well with no immediate complications Comments: 2 attempts without success of tap    BP 149/87 mmHg  Pulse 76  Temp(Src) 98.7 F (37.1 C) (Oral)  Resp 16  Ht 5\' 4"  (1.626 m)  Wt 100.245 kg  BMI 37.92 kg/m2  SpO2 98%  LMP 12/18/2015   I assumed care of the patient form Dr. Estell Harpin Reviewed labs and imaging. Patient will follow up with Dr. Ellan Lambert of Neurology. I spent 5 minutes in counseling on smoking cessation. Patient appears safe for discharge at this time.  Arthor Captain, PA-C 01/01/16 1820  Bethann Berkshire, MD 01/02/16 1257

## 2016-01-01 NOTE — ED Notes (Signed)
Pt ambulated to the bathroom with standby assistance, pt tolerated well. No complaints

## 2016-01-01 NOTE — ED Notes (Signed)
Pt cbg 93

## 2016-01-01 NOTE — ED Notes (Signed)
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 Pt has been experiencing some worsening related symptoms such as syncope, near syncope, dizziness, more frequent HA and vision changes. Denies pain at this time.

## 2016-01-01 NOTE — Procedures (Signed)
Interventional Radiology Procedure Note  Procedure: L3-L4 LP with fluoro.  15 mL clear CSF removed  Opening pressure: 29 cm H2O Closing Pressure: 18 cm H20  Complications: None  Estimated Blood Loss: 0  Recommendations: - Bedrest x 1 hr  Signed,  Sterling Big, MD

## 2017-06-10 ENCOUNTER — Emergency Department (HOSPITAL_COMMUNITY): Payer: 59

## 2017-06-10 ENCOUNTER — Emergency Department (HOSPITAL_COMMUNITY)
Admission: EM | Admit: 2017-06-10 | Discharge: 2017-06-11 | Disposition: A | Discharge status: Home / Self Care | Payer: 59 | Attending: Emergency Medicine | Admitting: Emergency Medicine

## 2017-06-10 ENCOUNTER — Encounter (HOSPITAL_COMMUNITY): Payer: Self-pay | Admitting: Emergency Medicine

## 2017-06-10 DIAGNOSIS — Y9302 Activity, running: Secondary | ICD-10-CM | POA: Insufficient documentation

## 2017-06-10 DIAGNOSIS — Y929 Unspecified place or not applicable: Secondary | ICD-10-CM | POA: Insufficient documentation

## 2017-06-10 DIAGNOSIS — S82852A Displaced trimalleolar fracture of left lower leg, initial encounter for closed fracture: Secondary | ICD-10-CM | POA: Diagnosis not present

## 2017-06-10 DIAGNOSIS — W010XXA Fall on same level from slipping, tripping and stumbling without subsequent striking against object, initial encounter: Secondary | ICD-10-CM | POA: Insufficient documentation

## 2017-06-10 DIAGNOSIS — S99912A Unspecified injury of left ankle, initial encounter: Secondary | ICD-10-CM | POA: Diagnosis present

## 2017-06-10 DIAGNOSIS — S82853A Displaced trimalleolar fracture of unspecified lower leg, initial encounter for closed fracture: Secondary | ICD-10-CM

## 2017-06-10 DIAGNOSIS — S82851A Displaced trimalleolar fracture of right lower leg, initial encounter for closed fracture: Secondary | ICD-10-CM

## 2017-06-10 DIAGNOSIS — F17210 Nicotine dependence, cigarettes, uncomplicated: Secondary | ICD-10-CM | POA: Diagnosis not present

## 2017-06-10 DIAGNOSIS — Y999 Unspecified external cause status: Secondary | ICD-10-CM | POA: Insufficient documentation

## 2017-06-10 HISTORY — DX: Displaced trimalleolar fracture of unspecified lower leg, initial encounter for closed fracture: S82.853A

## 2017-06-10 MED ORDER — OXYCODONE-ACETAMINOPHEN 5-325 MG PO TABS: 1.0000 | ORAL_TABLET | Freq: Four times a day (QID) | ORAL | 0 refills | Status: DC | PRN

## 2017-06-10 MED ORDER — MORPHINE SULFATE (PF) 4 MG/ML IV SOLN
6.0000 mg | Freq: Once | INTRAVENOUS | Status: AC
  Administered 2017-06-10: 6 mg via INTRAVENOUS
  Filled 2017-06-10: qty 2

## 2017-06-10 MED ORDER — MORPHINE SULFATE (PF) 4 MG/ML IV SOLN
4.0000 mg | Freq: Once | INTRAVENOUS | Status: DC
  Filled 2017-06-10: qty 1

## 2017-06-10 MED ORDER — MORPHINE SULFATE (PF) 4 MG/ML IV SOLN
4.0000 mg | Freq: Once | INTRAVENOUS | Status: AC
  Administered 2017-06-10: 4 mg via INTRAVENOUS
  Filled 2017-06-10: qty 1

## 2017-06-10 MED ORDER — FENTANYL CITRATE (PF) 100 MCG/2ML IJ SOLN
100.0000 ug | Freq: Once | INTRAMUSCULAR | Status: AC
  Administered 2017-06-10: 100 ug via INTRAVENOUS
  Filled 2017-06-10: qty 2

## 2017-06-10 NOTE — ED Triage Notes (Signed)
Patient arrives with complaint of right ankle injury. States slipped while running and felt a pop. Unable to weight bear afterward. Ankle appears deformed and swollen in triage. Patient in obvious pain.

## 2017-06-10 NOTE — ED Notes (Signed)
Extremity splinted, good sensation and color.

## 2017-06-10 NOTE — Discharge Instructions (Signed)
Please read and follow all provided instructions.  Your diagnoses today include:  1. Closed trimalleolar fracture of right ankle, initial encounter     Tests performed today include: Vital signs. See below for your results today.   Medications prescribed:  Take as prescribed   Home care instructions:  Follow any educational materials contained in this packet. DO NOT PUT WEIGHT ON RIGHT LEG.  Follow-up instructions: Please follow-up with Orthopedics for further evaluation of symptoms and treatment   Return instructions:  Please return to the Emergency Department if you do not get better, if you get worse, or new symptoms OR  - Fever (temperature greater than 101.75F)  - Bleeding that does not stop with holding pressure to the area    -Severe pain (please note that you may be more sore the day after your accident)  - Chest Pain  - Difficulty breathing  - Severe nausea or vomiting  - Inability to tolerate food and liquids  - Passing out  - Skin becoming red around your wounds  - Change in mental status (confusion or lethargy)  - New numbness or weakness    Please return if you have any other emergent concerns.  Additional Information:  Your vital signs today were: BP 129/74    Pulse 82    Temp 98.2 F (36.8 C) (Oral)    Resp 20    Ht 5\' 3"  (1.6 m)    Wt 104.3 kg (230 lb)    LMP 05/20/2017 (Approximate)    SpO2 100%    BMI 40.74 kg/m  If your blood pressure (BP) was elevated above 135/85 this visit, please have this repeated by your doctor within one month. ---------------

## 2017-06-10 NOTE — ED Provider Notes (Signed)
[facility]-EMERGENCY DEPT Provider Note   CSN: [id number] Arrival date & time: 06/10/17  2030     History   Chief Complaint Chief Complaint  Patient presents with  . Leg Injury    HPI [name] is a 26 y.o. female.  HPI 26 y.o. female, presents to the Emergency Department today due to right ankle injury PTA. Notes running and slipping with feeling a "pop." Pt unable to bear weight. Rates pain 10/10. Worse with movement. Pt states she saw her foot "flop" to the side. Aching sensation. No numbness/tingling. Able to move toes.   Past Medical History:  Diagnosis Date  . Pseudotumor cerebri     There are no active problems to display for this patient.   Past Surgical History:  Procedure Laterality Date  . spinal tap     x4    OB History    No data available       Home Medications    Prior to Admission medications   Medication Sig Start Date End Date Taking? Authorizing Provider  acetaminophen (TYLENOL) 500 MG tablet Take 500 mg by mouth every 6 (six) hours as needed. For pain    [provider]  naproxen sodium (ANAPROX) 220 MG tablet Take 220 mg by mouth 2 (two) times daily with a meal.    [provider]    Family History Family History  Problem Relation Age of Onset  . Diabetes Unknown   . Heart failure Unknown   . Breast cancer Unknown     Social History Social History  Substance Use Topics  . Smoking status: Current Every Day Smoker    Packs/day: 1.00    Types: Cigarettes  . Smokeless tobacco: Never Used  . Alcohol use No     Allergies   Amoxicillin and Bactrim [sulfamethoxazole-trimethoprim]   Review of Systems Review of Systems ROS reviewed and all are negative for acute change except as noted in the HPI.  Physical Exam Updated Vital Signs BP 133/87 (BP Location: Right Arm)   Pulse 85   Temp 98.2 F (36.8 C) (Oral)   Resp 20   Ht 5\' 3"  (1.6 m)   Wt 104.3 kg (230 lb)   LMP 05/20/2017 (Approximate)   SpO2 100%    BMI 40.74 kg/m   Physical Exam  Constitutional: She is oriented to person, place, and time. Vital signs are normal. She appears well-developed and well-nourished.  HENT:  Head: Normocephalic and atraumatic.  Right Ear: Hearing normal.  Left Ear: Hearing normal.  Eyes: Pupils are equal, round, and reactive to light. Conjunctivae and EOM are normal.  Neck: Normal range of motion. Neck supple.  Cardiovascular: Normal rate, regular rhythm, normal heart sounds and intact distal pulses.   Pulmonary/Chest: Effort normal and breath sounds normal.  Musculoskeletal:  Right ankle with obvious deformity. Closed. NVI. Cap refill <2 sec. Distal pulses appreciated. Swelling on lateral aspect with ecchymosis  Neurological: She is alert and oriented to person, place, and time.  Skin: Skin is warm and dry.  Psychiatric: She has a normal mood and affect. Her speech is normal and behavior is normal. Thought content normal.  Nursing note and vitals reviewed.  ED Treatments / Results  Labs (all labs ordered are listed, but only abnormal results are displayed) Labs Reviewed - No data to display  EKG  EKG Interpretation None       Radiology Dg Tibia/fibula Right  Result Date: 06/10/2017 CLINICAL DATA:  Fall, ankle pain/injury EXAM: RIGHT TIBIA AND FIBULA -  2 VIEW COMPARISON:  None. FINDINGS: No fracture or dislocation is seen in the proximal tibia/ fibula. The joint spaces are preserved. Distal tibia/ fibula is excluded from these images, better evaluated on dedicated ankle radiographs. IMPRESSION: No fracture or dislocation is seen in the proximal tibia/fibula. Electronically Signed   By: Charline Bills M.D.   On: 06/10/2017 22:52   Dg Ankle Complete Right  Result Date: 06/10/2017 CLINICAL DATA:  Fall, left ankle pain/injury EXAM: RIGHT ANKLE - COMPLETE 3+ VIEW COMPARISON:  None. FINDINGS: Minimally displaced distal fibular fracture, with less than 1/2 shaft width lateral displacement. Minimally  displaced medial malleolar fracture, with minimal inferior displacement. Associated nondisplaced posterior malleolar fracture on the lateral view. Ankle mortise is preserved. Moderate soft tissue swelling. IMPRESSION: Trimalleolar fracture, as above. Electronically Signed   By: Charline Bills M.D.   On: 06/10/2017 22:51   Dg Foot Complete Right  Result Date: 06/10/2017 CLINICAL DATA:  Fall, left ankle pain/injury EXAM: RIGHT FOOT COMPLETE - 3+ VIEW COMPARISON:  None. FINDINGS: Medial malleolar fracture is better visualized on dedicated ankle radiographs. Otherwise, no fracture is seen. The joint spaces are preserved. Mild soft tissue swelling along the ankle. IMPRESSION: No fracture is seen in the foot. Refer to dedicated ankle radiographs for further evaluation. Electronically Signed   By: Charline Bills M.D.   On: 06/10/2017 22:51    Procedures Reduction of ankle dislocation Date/Time: 06/10/2017 11:23 PM Performed by: Audry Pili Authorized by: Audry Pili  Consent: Verbal consent obtained. Risks and benefits: risks, benefits and alternatives were discussed Consent given by: patient Patient understanding: patient states understanding of the procedure being performed Patient identity confirmed: verbally with patient and arm band Time out: Immediately prior to procedure a "time out" was called to verify the correct patient, procedure, equipment, support staff and site/side marked as required. Local anesthesia used: no (Fentanyl IV)  Anesthesia: Local anesthesia used: no (Fentanyl IV)  Sedation: Patient sedated: no Patient tolerance: Patient tolerated the procedure well with no immediate complications    (including critical care time)  Medications Ordered in ED Medications  morphine 4 MG/ML injection 4 mg (not administered)  morphine 4 MG/ML injection 4 mg (4 mg Intravenous Given 06/10/17 2114)  fentaNYL (SUBLIMAZE) injection 100 mcg (100 mcg Intravenous Given 06/10/17 2153)      Initial Impression / Assessment and Plan / ED Course  I have reviewed the triage vital signs and the nursing notes.  Pertinent labs & imaging results that were available during my care of the patient were reviewed by me and considered in my medical decision making (see chart for details).  Final Clinical Impressions(s) / ED Diagnoses   {I have reviewed and evaluated the relevant imaging studies.  {I have reviewed the relevant previous healthcare records.  {I obtained HPI from historian.   ED Course:  Assessment: Patient X-Ray shows trimalleolar fracture. Lateral displacement was noted on initial evaluation. Reduced and stabilized with splint. Consult with orthopedics (Dr. Eulah Pont). Will splint in ED. Given crutches. Close follow up in office. I have reviewed the [place] Controlled Substance Reporting System. Given Rx #15 Percocet. NVI with Cap refill <2sec s/p splint application. Conservative therapy recommended and discussed. Patient will be discharged home & is agreeable with above plan. Returns precautions discussed. Pt appears safe for discharge.  Disposition/Plan:  DC Home Additional Verbal discharge instructions given and discussed with patient.  Pt Instructed to f/u with orthopedics in the next week for evaluation and treatment of symptoms. Return precautions given  Pt acknowledges and agrees with plan  Supervising Physician Mesner, Barbara Cower, MD  Final diagnoses:  Closed trimalleolar fracture of right ankle, initial encounter    New Prescriptions New Prescriptions   No medications on file       Wilber Bihari 06/10/17 2325    Mesner, Barbara Cower, MD 06/11/17 [phone]

## 2017-06-10 NOTE — ED Notes (Signed)
Patient taken to XRAY

## 2017-06-11 ENCOUNTER — Encounter (HOSPITAL_BASED_OUTPATIENT_CLINIC_OR_DEPARTMENT_OTHER): Payer: Self-pay | Admitting: *Deleted

## 2017-06-11 NOTE — Progress Notes (Signed)
Orthopedic Tech Progress Note Patient Details:  [name] [birth year] [id number]  Ortho Devices Type of Ortho Device: Short leg splint, Crutches Ortho Device/Splint Location: applied short leg splint to pt right ankle/leg.  pt tolerated fair.  pt stated she used crutches in past for previous injury.  Crutches sized and provided for pt.   Ortho Device/Splint Interventions: Application, Adjustment   Alvina Chou 06/11/2017, 12:08 AM

## 2017-06-12 NOTE — H&P (Signed)
[facility]  [address]   [address] [name] [address] [phone] A Division of [facility]   RE: Julie, West   [id number]      DOB: 08/31/1991 06-11-17  REASON FOR VISIT: Referral from the [facility] Emergency Room with right trimalleolar ankle fracture.   HPI:   [name] is 26 years old and yesterday on 06-10-17 she was running to get out of the rain in her garden and slipped suffering an external rotation injury to the right ankle. She was reduced and splinted in the emergency room. The pain is well controlled.  EXAMINATION: Well appearing female no apparent distress. The right lower extremity splint is intact. There is a painless passive motion at the toes. Neurovascularly intact at the toes.  IMAGES: X-rays reviewed by me demonstrate a mildly displaced trimalleolar ankle fracture to the right ankle.   ASSESSMENT & PLAN: I would recommend surgical stabilization of this ankle fracture once the swelling has subsided. She will elevate this as much as possible, non-weightbearing between now and then.    Jewel Baize.  Eulah Pont, M.D.  Electronically verified by Jewel Baize. Eulah Pont, M.D. TDM: jgc D 06-11-17 T  06-11-17 Cc:  [name], MD fax [phone]

## 2017-06-14 ENCOUNTER — Encounter (HOSPITAL_BASED_OUTPATIENT_CLINIC_OR_DEPARTMENT_OTHER): Payer: Self-pay

## 2017-06-15 ENCOUNTER — Ambulatory Visit (HOSPITAL_BASED_OUTPATIENT_CLINIC_OR_DEPARTMENT_OTHER): Payer: 59 | Admitting: Anesthesiology

## 2017-06-15 ENCOUNTER — Encounter (HOSPITAL_BASED_OUTPATIENT_CLINIC_OR_DEPARTMENT_OTHER): Payer: Self-pay | Admitting: *Deleted

## 2017-06-15 ENCOUNTER — Ambulatory Visit (HOSPITAL_BASED_OUTPATIENT_CLINIC_OR_DEPARTMENT_OTHER)
Admission: RE | Admit: 2017-06-15 | Discharge: 2017-06-15 | Disposition: A | Discharge status: Home / Self Care | Payer: 59 | Source: Ambulatory Visit | Attending: Orthopedic Surgery | Admitting: Orthopedic Surgery

## 2017-06-15 ENCOUNTER — Encounter (HOSPITAL_BASED_OUTPATIENT_CLINIC_OR_DEPARTMENT_OTHER)
Admission: RE | Disposition: A | Discharge status: Home / Self Care | Payer: Self-pay | Source: Ambulatory Visit | Attending: Orthopedic Surgery

## 2017-06-15 DIAGNOSIS — W010XXA Fall on same level from slipping, tripping and stumbling without subsequent striking against object, initial encounter: Secondary | ICD-10-CM | POA: Insufficient documentation

## 2017-06-15 DIAGNOSIS — S82851A Displaced trimalleolar fracture of right lower leg, initial encounter for closed fracture: Secondary | ICD-10-CM | POA: Insufficient documentation

## 2017-06-15 DIAGNOSIS — F17200 Nicotine dependence, unspecified, uncomplicated: Secondary | ICD-10-CM | POA: Insufficient documentation

## 2017-06-15 DIAGNOSIS — S82891A Other fracture of right lower leg, initial encounter for closed fracture: Secondary | ICD-10-CM

## 2017-06-15 HISTORY — DX: Displaced trimalleolar fracture of unspecified lower leg, initial encounter for closed fracture: S82.853A

## 2017-06-15 HISTORY — PX: ORIF ANKLE FRACTURE: SHX5408

## 2017-06-15 SURGERY — OPEN REDUCTION INTERNAL FIXATION (ORIF) ANKLE FRACTURE
Anesthesia: General | Site: Ankle | Laterality: Right

## 2017-06-15 MED ORDER — FENTANYL CITRATE (PF) 100 MCG/2ML IJ SOLN
50.0000 ug | INTRAMUSCULAR | Status: DC | PRN
  Administered 2017-06-15: 100 ug via INTRAVENOUS

## 2017-06-15 MED ORDER — ONDANSETRON HCL 4 MG/2ML IJ SOLN
INTRAMUSCULAR | Status: DC | PRN
  Administered 2017-06-15: 4 mg via INTRAVENOUS

## 2017-06-15 MED ORDER — DEXMEDETOMIDINE HCL IN NACL 200 MCG/50ML IV SOLN
INTRAVENOUS | Status: AC
  Filled 2017-06-15: qty 50

## 2017-06-15 MED ORDER — BUPIVACAINE HCL 0.25 % IJ SOLN
INTRAMUSCULAR | Status: DC | PRN
  Administered 2017-06-15: 20 mL

## 2017-06-15 MED ORDER — ONDANSETRON HCL 4 MG/2ML IJ SOLN: 4.0000 mg | Freq: Once | INTRAMUSCULAR | Status: DC | PRN

## 2017-06-15 MED ORDER — KETOROLAC TROMETHAMINE 30 MG/ML IJ SOLN
INTRAMUSCULAR | Status: AC
  Filled 2017-06-15: qty 1

## 2017-06-15 MED ORDER — CEFAZOLIN SODIUM-DEXTROSE 2-4 GM/100ML-% IV SOLN
2.0000 g | INTRAVENOUS | Status: AC
  Administered 2017-06-15: 2 g via INTRAVENOUS

## 2017-06-15 MED ORDER — OXYCODONE HCL 5 MG PO TABS
ORAL_TABLET | ORAL | Status: AC
  Filled 2017-06-15: qty 1

## 2017-06-15 MED ORDER — FENTANYL CITRATE (PF) 100 MCG/2ML IJ SOLN
INTRAMUSCULAR | Status: AC
  Filled 2017-06-15: qty 2

## 2017-06-15 MED ORDER — LACTATED RINGERS IV SOLN
INTRAVENOUS | Status: DC
  Administered 2017-06-15: 13:00:00 via INTRAVENOUS

## 2017-06-15 MED ORDER — FENTANYL CITRATE (PF) 100 MCG/2ML IJ SOLN
INTRAMUSCULAR | Status: AC
Start: 2017-06-15 — End: 2017-06-15
  Filled 2017-06-15: qty 2

## 2017-06-15 MED ORDER — MORPHINE SULFATE (PF) 10 MG/ML IV SOLN
INTRAVENOUS | Status: AC
  Filled 2017-06-15: qty 1

## 2017-06-15 MED ORDER — LIDOCAINE 2% (20 MG/ML) 5 ML SYRINGE
INTRAMUSCULAR | Status: DC | PRN
  Administered 2017-06-15: 40 mg via INTRAVENOUS

## 2017-06-15 MED ORDER — SCOPOLAMINE 1 MG/3DAYS TD PT72: 1.0000 | MEDICATED_PATCH | Freq: Once | TRANSDERMAL | Status: DC | PRN

## 2017-06-15 MED ORDER — CEFAZOLIN SODIUM-DEXTROSE 2-4 GM/100ML-% IV SOLN
INTRAVENOUS | Status: AC
  Filled 2017-06-15: qty 100

## 2017-06-15 MED ORDER — ASPIRIN EC 81 MG PO TBEC: 81.0000 mg | DELAYED_RELEASE_TABLET | Freq: Every day | ORAL | 0 refills | Status: DC

## 2017-06-15 MED ORDER — METHOCARBAMOL 500 MG PO TABS: 500.0000 mg | ORAL_TABLET | Freq: Four times a day (QID) | ORAL | 0 refills | Status: DC | PRN

## 2017-06-15 MED ORDER — MEPERIDINE HCL 25 MG/ML IJ SOLN: 6.2500 mg | INTRAMUSCULAR | Status: DC | PRN

## 2017-06-15 MED ORDER — OXYCODONE-ACETAMINOPHEN 5-325 MG PO TABS: 1.0000 | ORAL_TABLET | ORAL | 0 refills | Status: DC | PRN

## 2017-06-15 MED ORDER — ACETAMINOPHEN 500 MG PO TABS
1000.0000 mg | ORAL_TABLET | Freq: Once | ORAL | Status: AC
  Administered 2017-06-15: 1000 mg via ORAL

## 2017-06-15 MED ORDER — DEXMEDETOMIDINE HCL IN NACL 200 MCG/50ML IV SOLN
INTRAVENOUS | Status: DC | PRN
  Administered 2017-06-15: 100 ug via INTRAVENOUS

## 2017-06-15 MED ORDER — DOCUSATE SODIUM 100 MG PO CAPS: 100.0000 mg | ORAL_CAPSULE | Freq: Two times a day (BID) | ORAL | 0 refills | Status: DC

## 2017-06-15 MED ORDER — KETOROLAC TROMETHAMINE 30 MG/ML IJ SOLN
INTRAMUSCULAR | Status: DC | PRN
  Administered 2017-06-15: 30 mg via INTRAVENOUS

## 2017-06-15 MED ORDER — LACTATED RINGERS IV SOLN: INTRAVENOUS | Status: DC

## 2017-06-15 MED ORDER — MIDAZOLAM HCL 2 MG/2ML IJ SOLN
1.0000 mg | INTRAMUSCULAR | Status: DC | PRN
  Administered 2017-06-15: 2 mg via INTRAVENOUS

## 2017-06-15 MED ORDER — ONDANSETRON HCL 4 MG PO TABS: 4.0000 mg | ORAL_TABLET | Freq: Three times a day (TID) | ORAL | 0 refills | Status: DC | PRN

## 2017-06-15 MED ORDER — ACETAMINOPHEN 500 MG PO TABS
ORAL_TABLET | ORAL | Status: AC
  Filled 2017-06-15: qty 2

## 2017-06-15 MED ORDER — DEXAMETHASONE SODIUM PHOSPHATE 4 MG/ML IJ SOLN
INTRAMUSCULAR | Status: DC | PRN
  Administered 2017-06-15: 10 mg via INTRAVENOUS

## 2017-06-15 MED ORDER — PROPOFOL 10 MG/ML IV BOLUS
INTRAVENOUS | Status: DC | PRN
  Administered 2017-06-15: 200 mg via INTRAVENOUS
  Administered 2017-06-15: 100 mg via INTRAVENOUS

## 2017-06-15 MED ORDER — OXYCODONE HCL 5 MG PO TABS
5.0000 mg | ORAL_TABLET | Freq: Once | ORAL | Status: AC
  Administered 2017-06-15: 5 mg via ORAL

## 2017-06-15 MED ORDER — MIDAZOLAM HCL 2 MG/2ML IJ SOLN
INTRAMUSCULAR | Status: AC
  Filled 2017-06-15: qty 2

## 2017-06-15 MED ORDER — ROPIVACAINE HCL 5 MG/ML IJ SOLN
INTRAMUSCULAR | Status: DC | PRN
  Administered 2017-06-15: 20 mL via PERINEURAL
  Administered 2017-06-15: 10 mL via PERINEURAL

## 2017-06-15 MED ORDER — FENTANYL CITRATE (PF) 100 MCG/2ML IJ SOLN
25.0000 ug | INTRAMUSCULAR | Status: DC | PRN
  Administered 2017-06-15: 50 ug via INTRAVENOUS
  Administered 2017-06-15: 25 ug via INTRAVENOUS

## 2017-06-15 MED ORDER — MORPHINE SULFATE 10 MG/ML IJ SOLN
INTRAMUSCULAR | Status: DC | PRN
  Administered 2017-06-15 (×3): 2 mg via INTRAVENOUS

## 2017-06-15 MED ORDER — CHLORHEXIDINE GLUCONATE 4 % EX LIQD: 60.0000 mL | Freq: Once | CUTANEOUS | Status: DC

## 2017-06-15 SURGICAL SUPPLY — 81 items
BANDAGE ACE 4X5 VEL STRL LF (GAUZE/BANDAGES/DRESSINGS) ×2 IMPLANT
BANDAGE ACE 6X5 VEL STRL LF (GAUZE/BANDAGES/DRESSINGS) ×2 IMPLANT
BANDAGE ESMARK 6X9 LF (GAUZE/BANDAGES/DRESSINGS) ×1 IMPLANT
BIT DRILL 2.5X125 (BIT) ×2 IMPLANT
BIT DRILL 3.5X125 (BIT) ×1 IMPLANT
BIT DRILL CANN 2.7 (BIT) ×1
BIT DRILL COUNTER SINK (DRILL) ×1 IMPLANT
BIT DRILL SRG 2.7XCANN AO CPLG (BIT) ×1 IMPLANT
BIT DRL SRG 2.7XCANN AO CPLNG (BIT) ×1
BLADE SURG 15 STRL LF DISP TIS (BLADE) ×2 IMPLANT
BLADE SURG 15 STRL SS (BLADE) ×2
BNDG COHESIVE 4X5 TAN STRL (GAUZE/BANDAGES/DRESSINGS) ×2 IMPLANT
BNDG ESMARK 6X9 LF (GAUZE/BANDAGES/DRESSINGS) ×2
CHLORAPREP W/TINT 26ML (MISCELLANEOUS) ×4 IMPLANT
CLSR STERI-STRIP ANTIMIC 1/2X4 (GAUZE/BANDAGES/DRESSINGS) ×2 IMPLANT
COVER BACK TABLE 60X90IN (DRAPES) ×2 IMPLANT
CUFF TOURNIQUET SINGLE 24IN (TOURNIQUET CUFF) IMPLANT
CUFF TOURNIQUET SINGLE 34IN LL (TOURNIQUET CUFF) ×2 IMPLANT
DECANTER SPIKE VIAL GLASS SM (MISCELLANEOUS) ×2 IMPLANT
DRAPE EXTREMITY T 121X128X90 (DRAPE) ×2 IMPLANT
DRAPE IMP U-DRAPE 54X76 (DRAPES) ×2 IMPLANT
DRAPE OEC MINIVIEW 54X84 (DRAPES) ×2 IMPLANT
DRAPE U-SHAPE 47X51 STRL (DRAPES) ×2 IMPLANT
DRILL BIT 3.5X125 (BIT) ×1
DRILL COUNTER SINK (DRILL) ×2
DRSG EMULSION OIL 3X3 NADH (GAUZE/BANDAGES/DRESSINGS) ×2 IMPLANT
DRSG PAD ABDOMINAL 8X10 ST (GAUZE/BANDAGES/DRESSINGS) ×2 IMPLANT
ELECT REM PT RETURN 9FT ADLT (ELECTROSURGICAL) ×2
ELECTRODE REM PT RTRN 9FT ADLT (ELECTROSURGICAL) ×1 IMPLANT
GAUZE SPONGE 4X4 12PLY STRL (GAUZE/BANDAGES/DRESSINGS) ×2 IMPLANT
GLOVE BIO SURGEON STRL SZ7 (GLOVE) ×4 IMPLANT
GLOVE BIO SURGEON STRL SZ7.5 (GLOVE) ×4 IMPLANT
GLOVE BIOGEL PI IND STRL 7.0 (GLOVE) ×1 IMPLANT
GLOVE BIOGEL PI IND STRL 7.5 (GLOVE) ×1 IMPLANT
GLOVE BIOGEL PI IND STRL 8 (GLOVE) ×2 IMPLANT
GLOVE BIOGEL PI INDICATOR 7.0 (GLOVE) ×1
GLOVE BIOGEL PI INDICATOR 7.5 (GLOVE) ×1
GLOVE BIOGEL PI INDICATOR 8 (GLOVE) ×2
GOWN STRL REUS W/ TWL LRG LVL3 (GOWN DISPOSABLE) ×2 IMPLANT
GOWN STRL REUS W/ TWL XL LVL3 (GOWN DISPOSABLE) ×1 IMPLANT
GOWN STRL REUS W/TWL LRG LVL3 (GOWN DISPOSABLE) ×2
GOWN STRL REUS W/TWL XL LVL3 (GOWN DISPOSABLE) ×1
K-WIRE ORTHOPEDIC 1.4X150L (WIRE) ×4
KWIRE ORTHOPEDIC 1.4X150L (WIRE) ×2 IMPLANT
NEEDLE HYPO 22GX1.5 SAFETY (NEEDLE) ×2 IMPLANT
NS IRRIG 1000ML POUR BTL (IV SOLUTION) ×2 IMPLANT
PACK BASIN DAY SURGERY FS (CUSTOM PROCEDURE TRAY) ×2 IMPLANT
PAD CAST 4YDX4 CTTN HI CHSV (CAST SUPPLIES) ×1 IMPLANT
PADDING CAST ABS 4INX4YD NS (CAST SUPPLIES) ×2
PADDING CAST ABS COTTON 4X4 ST (CAST SUPPLIES) ×2 IMPLANT
PADDING CAST COTTON 4X4 STRL (CAST SUPPLIES) ×1
PADDING CAST COTTON 6X4 STRL (CAST SUPPLIES) ×2 IMPLANT
PENCIL BUTTON HOLSTER BLD 10FT (ELECTRODE) ×2 IMPLANT
PLATE TUBULAR 1/3 5H (Plate) ×2 IMPLANT
SCREW BONE CANNULATED 4X44MM (Screw) ×4 IMPLANT
SCREW CANC 2.5XFT HEX12X4X (Screw) ×1 IMPLANT
SCREW CANCELLOUS 4.0X12MM (Screw) ×1 IMPLANT
SCREW CANCELLOUS 4.0X14 (Screw) ×2 IMPLANT
SCREW CORTEX ST MATTA 3.5X12MM (Screw) ×4 IMPLANT
SCREW CORTEX ST MATTA 3.5X14 (Screw) ×2 IMPLANT
SCREW CORTEX ST MATTA 3.5X20 (Screw) ×2 IMPLANT
SLEEVE SCD COMPRESS KNEE MED (MISCELLANEOUS) ×2 IMPLANT
SPLINT FAST PLASTER 5X30 (CAST SUPPLIES) ×20
SPLINT PLASTER CAST FAST 5X30 (CAST SUPPLIES) ×20 IMPLANT
SPONGE LAP 4X18 X RAY DECT (DISPOSABLE) ×2 IMPLANT
SUCTION FRAZIER HANDLE 10FR (MISCELLANEOUS) ×1
SUCTION TUBE FRAZIER 10FR DISP (MISCELLANEOUS) ×1 IMPLANT
SUT ETHILON 3 0 PS 1 (SUTURE) IMPLANT
SUT MNCRL AB 4-0 PS2 18 (SUTURE) IMPLANT
SUT MON AB 2-0 CT1 36 (SUTURE) IMPLANT
SUT MON AB 3-0 SH 27 (SUTURE)
SUT MON AB 3-0 SH27 (SUTURE) IMPLANT
SUT VIC AB 0 SH 27 (SUTURE) ×2 IMPLANT
SUT VIC AB 2-0 SH 27 (SUTURE)
SUT VIC AB 2-0 SH 27XBRD (SUTURE) IMPLANT
SYR BULB 3OZ (MISCELLANEOUS) ×2 IMPLANT
SYR CONTROL 10ML LL (SYRINGE) ×2 IMPLANT
TOWEL OR 17X24 6PK STRL BLUE (TOWEL DISPOSABLE) ×4 IMPLANT
TOWEL OR NON WOVEN STRL DISP B (DISPOSABLE) ×2 IMPLANT
TUBE CONNECTING 20X1/4 (TUBING) ×2 IMPLANT
UNDERPAD 30X30 (UNDERPADS AND DIAPERS) ×2 IMPLANT

## 2017-06-15 NOTE — Discharge Instructions (Signed)
Elevate leg - Toes above nose as much as possible to reduce pain / swelling.  You may loosen and re-apply ace wrap if it feels too tight.  Weight Bearing:  Non weight bearing affected leg.  Okay to use Furniture conservator/restorer.  Diet: As you were doing prior to hospitalization   Shower:  You have a splint on, leave the splint in place and keep the splint dry with a plastic bag.  Dressing:  You have a splint. Leave the splint in place and we will change your bandages during your first follow-up appointment.    Activity:  Increase activity slowly as tolerated, but follow the weight bearing instructions below.  The rules on driving is that you can not be taking narcotics while you drive, and you must feel in control of the vehicle.    To prevent constipation:  Narcotic medicines cause constipation.  Wean these as soon as is appropriate.   You may use a stool softener such as -  Colace (over the counter) 100 mg by mouth twice a day  Drink plenty of fluids (prune juice may be helpful) and high fiber foods Miralax (over the counter) for constipation as needed.    Itching:  If you experience itching with your medications, try taking only a single pain pill, or even half a pain pill at a time.  You may take up to 10 pain pills per day, and you can also use benadryl over the counter for itching or also to help with sleep.   Precautions:  If you experience chest pain or shortness of breath - call 911 immediately for transfer to the hospital emergency department!!  If you develop a fever greater that 101 F, purulent drainage from wound, increased redness or drainage from wound, or calf pain -- Call the office at [phone]                                                 Follow- Up Appointment:  Please call for an appointment to be seen in 2 weeks [place] - [phone]     Regional Anesthesia Blocks  1. Numbness or the inability to move the "blocked" extremity may last from 3-48 hours  after placement. The length of time depends on the medication injected and your individual response to the medication. If the numbness is not going away after 48 hours, call your surgeon.  2. The extremity that is blocked will need to be protected until the numbness is gone and the  Strength has returned. Because you cannot feel it, you will need to take extra care to avoid injury. Because it may be weak, you may have difficulty moving it or using it. You may not know what position it is in without looking at it while the block is in effect.  3. For blocks in the legs and feet, returning to weight bearing and walking needs to be done carefully. You will need to wait until the numbness is entirely gone and the strength has returned. You should be able to move your leg and foot normally before you try and bear weight or walk. You will need someone to be with you when you first try to ensure you do not fall and possibly risk injury.  4. Bruising and tenderness at the needle site are common side effects and will resolve  in a few days.  5. Persistent numbness or new problems with movement should be communicated to the surgeon or the [facility] [phone] [facility] [phone]).      Post Anesthesia Home Care Instructions  Activity: Get plenty of rest for the remainder of the day. A responsible individual must stay with you for 24 hours following the procedure.  For the next 24 hours, DO NOT: -Drive a car -Advertising copywriter -Drink alcoholic beverages -Take any medication unless instructed by your physician -Make any legal decisions or sign important papers.  Meals: Start with liquid foods such as gelatin or soup. Progress to regular foods as tolerated. Avoid greasy, spicy, heavy foods. If nausea and/or vomiting occur, drink only clear liquids until the nausea and/or vomiting subsides. Call your physician if vomiting continues.  Special  Instructions/Symptoms: Your throat may feel dry or sore from the anesthesia or the breathing tube placed in your throat during surgery. If this causes discomfort, gargle with warm salt water. The discomfort should disappear within 24 hours.  If you had a scopolamine patch placed behind your ear for the management of post- operative nausea and/or vomiting:  1. The medication in the patch is effective for 72 hours, after which it should be removed.  Wrap patch in a tissue and discard in the trash. Wash hands thoroughly with soap and water. 2. You may remove the patch earlier than 72 hours if you experience unpleasant side effects which may include dry mouth, dizziness or visual disturbances. 3. Avoid touching the patch. Wash your hands with soap and water after contact with the patch.

## 2017-06-15 NOTE — Op Note (Signed)
06/15/2017  3:00 PM  PATIENT:  [name]    PRE-OPERATIVE DIAGNOSIS:  Displaced trimalleolar fracture of the right lower leg  S82.851  POST-OPERATIVE DIAGNOSIS:  Same  PROCEDURE:  OPEN REDUCTION INTERNAL FIXATION (ORIF)RIGHT TRIMALLEOLAR ANKLE FRACTURE  SURGEON:  [name], Julie Baize, MD  ASSISTANT: Julie Hacker, PA-C, he was present and scrubbed throughout the case, critical for completion in a timely fashion, and for retraction, instrumentation, and closure.   ANESTHESIA:   gen  PREOPERATIVE INDICATIONS:  [name] is a  26 y.o. female with a diagnosis of Displaced trimalleolar fracture of the right lower leg  S82.851 who failed conservative measures and elected for surgical management.    The risks benefits and alternatives were discussed with the patient preoperatively including but not limited to the risks of infection, bleeding, nerve injury, cardiopulmonary complications, the need for revision surgery, among others, and the patient was willing to proceed.  OPERATIVE IMPLANTS: stryker tubular plate and cannulated screws  OPERATIVE FINDINGS: Unstable ankle fracture. Stable syndesmosis post op  BLOOD LOSS: min  COMPLICATIONS: none  TOURNIQUET TIME:  OPERATIVE PROCEDURE:  Patient was identified in the preoperative holding area and site was marked by me He was transported to the operating theater and placed on the table in supine position taking care to pad all bony prominences. After a preincinduction time out anesthesia was induced. The right lower extremity was prepped and draped in normal sterile fashion and a pre-incision timeout was performed. [name] received ancef for preoperative antibiotics.   I made a lateral incision of roughly 7 cm dissection was carried down sharply to the distal fibula and then spreading dissection was used proximally to protect the superficial peroneal nerve. I sharply incised the periosteum and took care to protect the  peroneal tendons. I then debrided the fracture site and performed a reduction maneuver which was held in place with a clamp.   I placed a lag screw across the fracture  I then selected a 5-hole one third tubular plate and placed in a neutralization fashion care was taken distally so as not to penetrate the joint with the cancellus screws.  I then turned my attention medially where I created a 4 cm incision and dissected sharply down to the medial Mal fracture taking care to protect the saphenous vein. I debrided the fracture and reduced and held in place with a tenaculum. I then drilled and placed 2 partially threaded 45 mm cannulated screws one anterior and one posterior across the fracture.  I then stressed the syndesmosis and it was stable  I assesed the posterior mal piece and it was small enough to not require fixation as it involved less than 20% of the articular surface  The wound was then thoroughly irrigated and closed using a 0 Vicryl and absorbable Monocryl sutures. He was placed in a short leg splint.   POST OPERATIVE PLAN: Non-weightbearing. DVT prophylaxis will consist of chemical px and mobilization

## 2017-06-15 NOTE — Progress Notes (Signed)
Assisted Dr. Oddono with right, ultrasound guided, popliteal/saphenous block. Side rails up, monitors on throughout procedure. See vital signs in flow sheet. Tolerated Procedure well. 

## 2017-06-15 NOTE — Anesthesia Postprocedure Evaluation (Signed)
Anesthesia Post Note  Patient: [name]  Procedure(s) Performed: Procedure(s) (LRB): OPEN REDUCTION INTERNAL FIXATION (ORIF)RIGHT TRIMALLEOLAR ANKLE FRACTURE WITHOUT POSTERIOR FIXATION (Right)     Patient location during evaluation: PACU Anesthesia Type: General Level of consciousness: awake and alert Pain management: pain level controlled Vital Signs Assessment: post-procedure vital signs reviewed and stable Respiratory status: spontaneous breathing, nonlabored ventilation, respiratory function stable and patient connected to nasal cannula oxygen Cardiovascular status: blood pressure returned to baseline and stable Postop Assessment: no signs of nausea or vomiting Anesthetic complications: no    Last Vitals:  Vitals:   06/15/17 1311 06/15/17 1531  BP:  (!) 95/48  Pulse: 78 71  Resp: 11   Temp:  (P) 36.4 C    Last Pain:  Vitals:   06/15/17 1249  TempSrc: Oral  PainSc: 7                  [name]

## 2017-06-15 NOTE — Anesthesia Procedure Notes (Signed)
Procedure Name: LMA Insertion Performed by: Taylee Gunnells W Pre-anesthesia Checklist: Patient identified, Emergency Drugs available, Suction available and Patient being monitored Patient Re-evaluated:Patient Re-evaluated prior to induction Oxygen Delivery Method: Circle system utilized Preoxygenation: Pre-oxygenation with 100% oxygen Induction Type: IV induction Ventilation: Mask ventilation without difficulty LMA: LMA inserted LMA Size: 4.0 Number of attempts: 1 Placement Confirmation: positive ETCO2 Tube secured with: Tape Dental Injury: Teeth and Oropharynx as per pre-operative assessment        

## 2017-06-15 NOTE — Interval H&P Note (Signed)
History and Physical Interval Note:  06/15/2017 12:36 PM  [name]  has presented today for surgery, with the diagnosis of Displaced trimalleolar fracture of the right lower leg  S82.851  The various methods of treatment have been discussed with the patient and family. After consideration of risks, benefits and other options for treatment, the patient has consented to  Procedure(s): OPEN REDUCTION INTERNAL FIXATION (ORIF)RIGHT TRIMALLEOLAR ANKLE FRACTURE (Right) as a surgical intervention .  The patient's history has been reviewed, patient examined, no change in status, stable for surgery.  I have reviewed the patient's chart and labs.  Questions were answered to the patient's satisfaction.     [name]

## 2017-06-15 NOTE — Anesthesia Procedure Notes (Signed)
Anesthesia Regional Block: Ankle block (SAPHENOUS NERVE BLOCK BELOW THE KNEE)   Pre-Anesthetic Checklist: ,, timeout performed, Correct Patient, Correct Site, Correct Laterality, Correct Procedure, Correct Position, site marked, Risks and benefits discussed,  Surgical consent,  Pre-op evaluation,  At surgeon's request and post-op pain management  Laterality: Right  Prep: chloraprep       Needles:  Injection technique: Single-shot  Needle Type: Echogenic Stimulator Needle     Needle Length: 5cm  Needle Gauge: 22     Additional Needles:   Procedures: ultrasound guided, nerve stimulator,,,,,,  Narrative:  Start time: 06/15/2017 1:08 PM End time: 06/15/2017 1:30 PM Injection made incrementally with aspirations every 5 mL.  Performed by: Personally  Anesthesiologist: Lael Pilch  Additional Notes: Functioning IV was confirmed and monitors were applied.  A 65mm 22ga Arrow echogenic stimulator needle was used. Sterile prep and drape,hand hygiene and sterile gloves were used. Ultrasound guidance: relevant anatomy identified, needle position confirmed, local anesthetic spread visualized around nerve(s)., vascular puncture avoided.  Image printed for medical record. Negative aspiration and negative test dose prior to incremental administration of local anesthetic. The patient tolerated the procedure well.

## 2017-06-15 NOTE — Anesthesia Procedure Notes (Signed)
Anesthesia Regional Block: Adductor canal block   Pre-Anesthetic Checklist: ,, timeout performed, Correct Patient, Correct Site, Correct Laterality, Correct Procedure, Correct Position, site marked, Risks and benefits discussed,  Surgical consent,  Pre-op evaluation,  At surgeon's request and post-op pain management  Laterality: Right  Prep: chloraprep       Needles:  Injection technique: Single-shot  Needle Type: Echogenic Stimulator Needle     Needle Length: 5cm  Needle Gauge: 22     Additional Needles:   Procedures: ultrasound guided, nerve stimulator,,,,,,  Narrative:  Start time: 06/15/2017 1:07 PM End time: 06/15/2017 1:20 PM Injection made incrementally with aspirations every 5 mL.  Performed by: Personally  Anesthesiologist: Sukhraj Esquivias  Additional Notes: Functioning IV was confirmed and monitors were applied.  A 4mm 22ga Arrow echogenic stimulator needle was used. Sterile prep and drape,hand hygiene and sterile gloves were used. Ultrasound guidance: relevant anatomy identified, needle position confirmed, local anesthetic spread visualized around nerve(s)., vascular puncture avoided.  Image printed for medical record. Negative aspiration and negative test dose prior to incremental administration of local anesthetic. The patient tolerated the procedure well.

## 2017-06-15 NOTE — Anesthesia Preprocedure Evaluation (Signed)
Anesthesia Evaluation  Patient identified by MRN, date of birth, ID band Patient awake    Reviewed: Allergy & Precautions, NPO status , Patient's Chart, lab work & pertinent test results  Airway Mallampati: II  TM Distance: >3 FB Neck ROM: Full    Dental no notable dental hx.    Pulmonary neg pulmonary ROS, Current Smoker,    Pulmonary exam normal breath sounds clear to auscultation       Cardiovascular negative cardio ROS Normal cardiovascular exam Rhythm:Regular Rate:Normal     Neuro/Psych negative neurological ROS  negative psych ROS   GI/Hepatic negative GI ROS, Neg liver ROS,   Endo/Other  negative endocrine ROS  Renal/GU negative Renal ROS  negative genitourinary   Musculoskeletal negative musculoskeletal ROS (+)   Abdominal   Peds negative pediatric ROS (+)  Hematology negative hematology ROS (+)   Anesthesia Other Findings   Reproductive/Obstetrics negative OB ROS                             Anesthesia Physical Anesthesia Plan  ASA: II  Anesthesia Plan: General   Post-op Pain Management: GA combined w/ Regional for post-op pain   Induction: Intravenous  PONV Risk Score and Plan: 2 and Ondansetron, Dexamethasone, Treatment may vary due to age or medical condition and Midazolam  Airway Management Planned: LMA  Additional Equipment:   Intra-op Plan:   Post-operative Plan:   Informed Consent:   Plan Discussed with: CRNA and Surgeon  Anesthesia Plan Comments: ( )        Anesthesia Quick Evaluation

## 2017-06-15 NOTE — Progress Notes (Signed)
Inadvertently documented at 1451 instead of 1351.

## 2017-06-15 NOTE — Transfer of Care (Signed)
Immediate Anesthesia Transfer of Care Note  Patient: [name]  Procedure(s) Performed: Procedure(s): OPEN REDUCTION INTERNAL FIXATION (ORIF)RIGHT TRIMALLEOLAR ANKLE FRACTURE WITHOUT POSTERIOR FIXATION (Right)  Patient Location: PACU  Anesthesia Type:General  Level of Consciousness: awake and sedated  Airway & Oxygen Therapy: Patient Spontanous Breathing and Patient connected to face mask oxygen  Post-op Assessment: Report given to RN and Post -op Vital signs reviewed and stable  Post vital signs: Reviewed and stable  Last Vitals:  Vitals:   06/15/17 1310 06/15/17 1311  BP:    Pulse: 79 78  Resp: 20 11  Temp:      Last Pain:  Vitals:   06/15/17 1249  TempSrc: Oral  PainSc: 7       Patients Stated Pain Goal: 3 (06/15/17 1249)  Complications: No apparent anesthesia complications

## 2017-06-18 ENCOUNTER — Encounter (HOSPITAL_BASED_OUTPATIENT_CLINIC_OR_DEPARTMENT_OTHER): Payer: Self-pay | Admitting: Orthopedic Surgery

## 2020-10-13 ENCOUNTER — Other Ambulatory Visit: Payer: Self-pay

## 2020-10-13 ENCOUNTER — Encounter: Payer: Medicaid Other | Attending: Obstetrics and Gynecology

## 2020-10-13 DIAGNOSIS — O24419 Gestational diabetes mellitus in pregnancy, unspecified control: Secondary | ICD-10-CM | POA: Diagnosis not present

## 2020-10-13 NOTE — Progress Notes (Signed)
Patient was seen on 10/13/20 for Gestational Diabetes self-management class at the [facility]. The following learning objectives were met by the patient during this course:   States the definition of Gestational Diabetes  States why dietary management is important in controlling blood glucose  Describes the effects each nutrient has on blood glucose levels  Demonstrates ability to create a balanced meal plan  Demonstrates carbohydrate counting   States when to check blood glucose levels  Demonstrates proper blood glucose monitoring techniques  States the effect of stress and exercise on blood glucose levels  States the importance of limiting caffeine and abstaining from alcohol and smoking  Blood glucose monitor given: none. Has meter and is checking prior to class.  Patient instructed to monitor glucose levels: FBS: 60 - <95; 1 hour: <140; 2 hour: <120  Patient received handouts:  Nutrition Diabetes and Pregnancy, including carb counting list  Patient will be seen for follow-up as needed.

## 2020-11-07 ENCOUNTER — Other Ambulatory Visit: Payer: Self-pay

## 2020-11-07 ENCOUNTER — Inpatient Hospital Stay (HOSPITAL_COMMUNITY)
Admission: AD | Admit: 2020-11-07 | Discharge: 2020-11-11 | DRG: 786 | Disposition: A | Discharge status: Home / Self Care | Payer: Medicaid Other | Attending: Obstetrics and Gynecology | Admitting: Obstetrics and Gynecology

## 2020-11-07 ENCOUNTER — Inpatient Hospital Stay (HOSPITAL_COMMUNITY): Payer: Medicaid Other

## 2020-11-07 ENCOUNTER — Encounter (HOSPITAL_COMMUNITY): Payer: Self-pay

## 2020-11-07 DIAGNOSIS — J9601 Acute respiratory failure with hypoxia: Secondary | ICD-10-CM | POA: Diagnosis not present

## 2020-11-07 DIAGNOSIS — O98513 Other viral diseases complicating pregnancy, third trimester: Secondary | ICD-10-CM | POA: Diagnosis not present

## 2020-11-07 DIAGNOSIS — O288 Other abnormal findings on antenatal screening of mother: Secondary | ICD-10-CM

## 2020-11-07 DIAGNOSIS — O368390 Maternal care for abnormalities of the fetal heart rate or rhythm, unspecified trimester, not applicable or unspecified: Secondary | ICD-10-CM

## 2020-11-07 DIAGNOSIS — O9952 Diseases of the respiratory system complicating childbirth: Secondary | ICD-10-CM | POA: Diagnosis present

## 2020-11-07 DIAGNOSIS — O24415 Gestational diabetes mellitus in pregnancy, controlled by oral hypoglycemic drugs: Secondary | ICD-10-CM | POA: Diagnosis present

## 2020-11-07 DIAGNOSIS — Z3A36 36 weeks gestation of pregnancy: Secondary | ICD-10-CM | POA: Diagnosis not present

## 2020-11-07 DIAGNOSIS — O9852 Other viral diseases complicating childbirth: Secondary | ICD-10-CM | POA: Diagnosis present

## 2020-11-07 DIAGNOSIS — O99892 Other specified diseases and conditions complicating childbirth: Secondary | ICD-10-CM | POA: Diagnosis present

## 2020-11-07 DIAGNOSIS — U071 COVID-19: Secondary | ICD-10-CM | POA: Diagnosis present

## 2020-11-07 DIAGNOSIS — I517 Cardiomegaly: Secondary | ICD-10-CM | POA: Diagnosis present

## 2020-11-07 DIAGNOSIS — J1282 Pneumonia due to coronavirus disease 2019: Secondary | ICD-10-CM | POA: Diagnosis present

## 2020-11-07 DIAGNOSIS — F17210 Nicotine dependence, cigarettes, uncomplicated: Secondary | ICD-10-CM | POA: Diagnosis present

## 2020-11-07 DIAGNOSIS — F419 Anxiety disorder, unspecified: Secondary | ICD-10-CM | POA: Diagnosis present

## 2020-11-07 DIAGNOSIS — O368330 Maternal care for abnormalities of the fetal heart rate or rhythm, third trimester, not applicable or unspecified: Secondary | ICD-10-CM

## 2020-11-07 DIAGNOSIS — O99344 Other mental disorders complicating childbirth: Secondary | ICD-10-CM | POA: Diagnosis present

## 2020-11-07 DIAGNOSIS — R0602 Shortness of breath: Secondary | ICD-10-CM

## 2020-11-07 DIAGNOSIS — O99214 Obesity complicating childbirth: Secondary | ICD-10-CM | POA: Diagnosis present

## 2020-11-07 DIAGNOSIS — A419 Sepsis, unspecified organism: Secondary | ICD-10-CM | POA: Diagnosis present

## 2020-11-07 DIAGNOSIS — R4182 Altered mental status, unspecified: Secondary | ICD-10-CM | POA: Diagnosis not present

## 2020-11-07 DIAGNOSIS — O99334 Smoking (tobacco) complicating childbirth: Secondary | ICD-10-CM | POA: Diagnosis present

## 2020-11-07 DIAGNOSIS — O24419 Gestational diabetes mellitus in pregnancy, unspecified control: Secondary | ICD-10-CM | POA: Diagnosis present

## 2020-11-07 DIAGNOSIS — A4189 Other specified sepsis: Secondary | ICD-10-CM | POA: Diagnosis present

## 2020-11-07 DIAGNOSIS — F32A Depression, unspecified: Secondary | ICD-10-CM | POA: Diagnosis present

## 2020-11-07 DIAGNOSIS — O24420 Gestational diabetes mellitus in childbirth, diet controlled: Secondary | ICD-10-CM | POA: Diagnosis present

## 2020-11-07 LAB — COMPREHENSIVE METABOLIC PANEL
ALT: 11 U/L (ref 0–44)
AST: 33 U/L (ref 15–41)
Albumin: 2.4 g/dL — ABNORMAL LOW (ref 3.5–5.0)
Alkaline Phosphatase: 96 U/L (ref 38–126)
Anion gap: 10 (ref 5–15)
BUN: 6 mg/dL (ref 6–20)
CO2: 17 mmol/L — ABNORMAL LOW (ref 22–32)
Calcium: 7.9 mg/dL — ABNORMAL LOW (ref 8.9–10.3)
Chloride: 101 mmol/L (ref 98–111)
Creatinine, Ser: 0.76 mg/dL (ref 0.44–1.00)
GFR, Estimated: >60 mL/min (ref >60)
Glucose, Bld: 92 mg/dL (ref 70–99)
Potassium: 3.5 mmol/L (ref 3.5–5.1)
Sodium: 128 mmol/L — ABNORMAL LOW (ref 135–145)
Total Bilirubin: 0.7 mg/dL (ref 0.3–1.2)
Total Protein: 6 g/dL — ABNORMAL LOW (ref 6.5–8.1)

## 2020-11-07 LAB — CBC WITH DIFFERENTIAL/PLATELET
Abs Immature Granulocytes: 0.09 10*3/uL — ABNORMAL HIGH (ref 0.00–0.07)
Basophils Absolute: 0 10*3/uL (ref 0.0–0.1)
Basophils Relative: 0 %
Eosinophils Absolute: 0 10*3/uL (ref 0.0–0.5)
Eosinophils Relative: 0 %
HCT: 33.5 % — ABNORMAL LOW (ref 36.0–46.0)
Hemoglobin: 11.7 g/dL — ABNORMAL LOW (ref 12.0–15.0)
Immature Granulocytes: 1 %
Lymphocytes Relative: 7 %
Lymphs Abs: 0.5 10*3/uL — ABNORMAL LOW (ref 0.7–4.0)
MCH: 31.3 pg (ref 26.0–34.0)
MCHC: 34.9 g/dL (ref 30.0–36.0)
MCV: 89.6 fL (ref 80.0–100.0)
Monocytes Absolute: 0.1 10*3/uL (ref 0.1–1.0)
Monocytes Relative: 2 %
Neutro Abs: 6.1 10*3/uL (ref 1.7–7.7)
Neutrophils Relative %: 90 %
Platelets: 146 10*3/uL — ABNORMAL LOW (ref 150–400)
RBC: 3.74 MIL/uL — ABNORMAL LOW (ref 3.87–5.11)
RDW: 13.9 % (ref 11.5–15.5)
WBC: 6.8 10*3/uL (ref 4.0–10.5)
nRBC: 0 % (ref 0.0–0.2)

## 2020-11-07 LAB — RESP PANEL BY RT-PCR (FLU A&B, COVID) ARPGX2
Influenza A by PCR: NEGATIVE
Influenza B by PCR: NEGATIVE
SARS Coronavirus 2 by RT PCR: POSITIVE — AB

## 2020-11-07 LAB — C-REACTIVE PROTEIN: CRP: 18.8 mg/dL — ABNORMAL HIGH (ref <1.0)

## 2020-11-07 LAB — TYPE AND SCREEN
ABO/RH(D): B POS
Antibody Screen: NEGATIVE

## 2020-11-07 LAB — BRAIN NATRIURETIC PEPTIDE: B Natriuretic Peptide: 130.9 pg/mL — ABNORMAL HIGH (ref 0.0–100.0)

## 2020-11-07 LAB — GLUCOSE, CAPILLARY: Glucose-Capillary: 126 mg/dL — ABNORMAL HIGH (ref 70–99)

## 2020-11-07 LAB — POCT FERN TEST: POCT Fern Test: NEGATIVE

## 2020-11-07 LAB — TROPONIN I (HIGH SENSITIVITY): Troponin I (High Sensitivity): 14 ng/L (ref <18)

## 2020-11-07 LAB — PROCALCITONIN: Procalcitonin: 0.61 ng/mL

## 2020-11-07 MED ORDER — SODIUM CHLORIDE 0.9 % IV SOLN
200.0000 mg | Freq: Once | INTRAVENOUS | Status: AC
  Administered 2020-11-07: 200 mg via INTRAVENOUS
  Filled 2020-11-07: qty 40

## 2020-11-07 MED ORDER — CALCIUM CARBONATE ANTACID 500 MG PO CHEW
2.0000 | CHEWABLE_TABLET | ORAL | Status: DC | PRN
  Administered 2020-11-08 (×2): 400 mg via ORAL
  Filled 2020-11-07 (×2): qty 2

## 2020-11-07 MED ORDER — ACETAMINOPHEN 500 MG PO TABS
1000.0000 mg | ORAL_TABLET | Freq: Four times a day (QID) | ORAL | Status: DC | PRN
  Administered 2020-11-07 – 2020-11-11 (×6): 1000 mg via ORAL
  Filled 2020-11-07 (×6): qty 2

## 2020-11-07 MED ORDER — ACETAMINOPHEN 325 MG PO TABS: 650.0000 mg | ORAL_TABLET | ORAL | Status: DC | PRN

## 2020-11-07 MED ORDER — ZOLPIDEM TARTRATE 5 MG PO TABS: 5.0000 mg | ORAL_TABLET | Freq: Every evening | ORAL | Status: DC | PRN

## 2020-11-07 MED ORDER — TERBUTALINE SULFATE 1 MG/ML IJ SOLN
0.2500 mg | Freq: Once | INTRAMUSCULAR | Status: AC
Start: 2020-11-07 — End: 2020-11-07
  Administered 2020-11-07: 0.25 mg via SUBCUTANEOUS
  Filled 2020-11-07: qty 1

## 2020-11-07 MED ORDER — DOCUSATE SODIUM 100 MG PO CAPS
100.0000 mg | ORAL_CAPSULE | Freq: Every day | ORAL | Status: DC
  Administered 2020-11-09 – 2020-11-11 (×3): 100 mg via ORAL
  Filled 2020-11-07 (×4): qty 1

## 2020-11-07 MED ORDER — FUROSEMIDE 10 MG/ML IJ SOLN
20.0000 mg | Freq: Once | INTRAMUSCULAR | Status: AC
  Administered 2020-11-07: 20 mg via INTRAVENOUS
  Filled 2020-11-07: qty 2

## 2020-11-07 MED ORDER — PRENATAL MULTIVITAMIN CH
1.0000 | ORAL_TABLET | Freq: Every day | ORAL | Status: DC
  Administered 2020-11-08: 1 via ORAL
  Filled 2020-11-07: qty 1

## 2020-11-07 MED ORDER — GUAIFENESIN-DM 100-10 MG/5ML PO SYRP
10.0000 mL | ORAL_SOLUTION | ORAL | Status: DC | PRN
  Administered 2020-11-07 – 2020-11-11 (×3): 10 mL via ORAL
  Filled 2020-11-07 (×4): qty 10

## 2020-11-07 MED ORDER — SODIUM CHLORIDE 0.9 % IV SOLN
100.0000 mg | Freq: Every day | INTRAVENOUS | Status: AC
  Administered 2020-11-08 – 2020-11-11 (×4): 100 mg via INTRAVENOUS
  Filled 2020-11-07: qty 100
  Filled 2020-11-07 (×4): qty 20

## 2020-11-07 MED ORDER — HYDROCOD POLST-CPM POLST ER 10-8 MG/5ML PO SUER: 5.0000 mL | Freq: Two times a day (BID) | ORAL | Status: DC | PRN

## 2020-11-07 MED ORDER — LACTATED RINGERS IV BOLUS
1000.0000 mL | Freq: Once | INTRAVENOUS | Status: AC
  Administered 2020-11-07: 1000 mL via INTRAVENOUS

## 2020-11-07 NOTE — Consult Note (Signed)
Medical Consultation   [name]  GYI:[id number]  DOB: Feb 21, 1991  DOA: 11/07/2020  PCP: Patient, No Pcp Per     Requesting physician: Dr. Normand Sloop  Reason for consultation: COVID-19   History of Present Illness: [name] is an 29 y.o. female 36w gestation.  Admitted to MAU for contractions, cough, fever.  Pt unvaccinated to COVID-19.  Started feeling ill 2 days ago.  Symptoms are progressively worsening, symptoms are constant.  Nothing makes better or worse.  WBC nl, COVID+, BNP 130, CXR shows cardiomegally and B pulmonary edema but "infection not excluded".  Hospitalist consulted.   Review of Systems:  ROS As per HPI otherwise 10 point review of systems negative.     Past Medical History: Past Medical History:  Diagnosis Date  . Pseudotumor cerebri   . Trimalleolar fracture of ankle, closed 06/10/2017   right    Past Surgical History: Past Surgical History:  Procedure Laterality Date  . LUMBAR PUNCTURE  09/09/2002; 10/14/2002; 05/11/2004   under anesthesia  . ORIF ANKLE FRACTURE Right 06/15/2017   Procedure: OPEN REDUCTION INTERNAL FIXATION (ORIF)RIGHT TRIMALLEOLAR ANKLE FRACTURE WITHOUT POSTERIOR FIXATION;  Surgeon: Sheral Apley, MD;  Location: [facility];  Service: Orthopedics;  Laterality: Right;     Allergies:   Allergies  Allergen Reactions  . Amoxicillin Rash  . Bactrim [Sulfamethoxazole-Trimethoprim] Rash     Social History:  reports that she has been smoking cigarettes. She has a 7.00 pack-year smoking history. She has never used smokeless tobacco. She reports current alcohol use. She reports that she does not use drugs.   Family History: Family History  Problem Relation Age of Onset  . Diabetes Mother   . Diabetes Other   . Heart failure Other   . Breast cancer Other       Physical Exam: Vitals:   11/07/20 2015 11/07/20 2045 11/07/20 2057 11/07/20 2110  BP:   134/69   Pulse:   76    Resp:   (!) 23   Temp:      TempSrc:      SpO2: 96% 96%  100%    Constitutional: Alert and awake, oriented x3, not in any acute distress. Eyes: PERLA, EOMI, irises appear normal, anicteric sclera,  ENMT: external ears and nose appear normal            Lips appears normal, oropharynx mucosa, tongue, posterior pharynx appear normal  Neck: neck appears normal, no masses, normal ROM, no thyromegaly, no JVD  CVS: S1-S2 clear, no murmur rubs or gallops, no LE edema, normal pedal pulses  Respiratory:  Tachypnea present, slightly increased WOB Abdomen: soft nontender, nondistended, normal bowel sounds, no hepatosplenomegaly, no hernias  Musculoskeletal: : no cyanosis, clubbing or edema noted bilaterally Neuro: Cranial nerves II-XII intact, strength, sensation, reflexes Psych: judgement and insight appear normal, stable mood and affect, mental status Skin: no rashes or lesions or ulcers, no induration or nodules    Data reviewed:  I have personally reviewed following labs and imaging studies Labs:  CBC: Recent Labs  Lab 11/07/20 1813  WBC 6.8  NEUTROABS 6.1  HGB 11.7*  HCT 33.5*  MCV 89.6  PLT 146*    Basic Metabolic Panel: Recent Labs  Lab 11/07/20 1813  NA 128*  K 3.5  CL 101  CO2 17*  GLUCOSE 92  BUN 6  CREATININE 0.76  CALCIUM 7.9*   GFR CrCl cannot be calculated (  Unknown ideal weight.). Liver Function Tests: Recent Labs  Lab 11/07/20 1813  AST 33  ALT 11  ALKPHOS 96  BILITOT 0.7  PROT 6.0*  ALBUMIN 2.4*   No results for input(s): LIPASE, AMYLASE in the last 168 hours. No results for input(s): AMMONIA in the last 168 hours. Coagulation profile No results for input(s): INR, PROTIME in the last 168 hours.  Cardiac Enzymes: No results for input(s): CKTOTAL, CKMB, CKMBINDEX, TROPONINI in the last 168 hours. BNP: Invalid input(s): POCBNP CBG: No results for input(s): GLUCAP in the last 168 hours. D-Dimer No results for input(s): DDIMER in the last 72  hours. Hgb A1c No results for input(s): HGBA1C in the last 72 hours. Lipid Profile No results for input(s): CHOL, HDL, LDLCALC, TRIG, CHOLHDL, LDLDIRECT in the last 72 hours. Thyroid function studies No results for input(s): TSH, T4TOTAL, T3FREE, THYROIDAB in the last 72 hours.  Invalid input(s): FREET3 Anemia work up No results for input(s): VITAMINB12, FOLATE, FERRITIN, TIBC, IRON, RETICCTPCT in the last 72 hours. Urinalysis    Component Value Date/Time   COLORURINE YELLOW 01/01/2016 1040   APPEARANCEUR CLOUDY (A) 01/01/2016 1040   LABSPEC 1.027 01/01/2016 1040   PHURINE 6.0 01/01/2016 1040   GLUCOSEU NEGATIVE 01/01/2016 1040   HGBUR NEGATIVE 01/01/2016 1040   BILIRUBINUR NEGATIVE 01/01/2016 1040   KETONESUR NEGATIVE 01/01/2016 1040   PROTEINUR NEGATIVE 01/01/2016 1040   UROBILINOGEN 0.2 06/26/2008 2235   NITRITE NEGATIVE 01/01/2016 1040   LEUKOCYTESUR NEGATIVE 01/01/2016 1040     Sepsis Labs Invalid input(s): PROCALCITONIN,  WBC,  LACTICIDVEN Microbiology Recent Results (from the past 240 hour(s))  Resp Panel by RT-PCR (Flu A&B, Covid) Nasopharyngeal Swab     Status: Abnormal   Collection Time: 11/07/20  6:13 PM   Specimen: Nasopharyngeal Swab; Nasopharyngeal(NP) swabs in vial transport medium  Result Value Ref Range Status   SARS Coronavirus 2 by RT PCR POSITIVE (A) NEGATIVE Final    Comment: RESULT CALLED TO, READ BACK BY AND VERIFIED WITH: RN V FISHER [phone] 1937 MLM (NOTE) SARS-CoV-2 target nucleic acids are DETECTED.  The SARS-CoV-2 RNA is generally detectable in upper respiratory specimens during the acute phase of infection. Positive results are indicative of the presence of the identified virus, but do not rule out bacterial infection or co-infection with other pathogens not detected by the test. Clinical correlation with patient history and other diagnostic information is necessary to determine patient infection status. The expected result is  Negative.  Fact Sheet for Patients: BloggerCourse.com  Fact Sheet for Healthcare Providers: SeriousBroker.it  This test is not yet approved or cleared by the [place] FDA and  has been authorized for detection and/or diagnosis of SARS-CoV-2 by FDA under an Emergency Use Authorization (EUA).  This EUA will remain in effect (meaning this test can be used)  for the duration of  the COVID-19 declaration under Section 564(b)(1) of the Act, 21 U.S.C. section 360bbb-3(b)(1), unless the authorization is terminated or revoked sooner.     Influenza A by PCR NEGATIVE NEGATIVE Final   Influenza B by PCR NEGATIVE NEGATIVE Final    Comment: (NOTE) The Xpert Xpress SARS-CoV-2/FLU/RSV plus assay is intended as an aid in the diagnosis of influenza from Nasopharyngeal swab specimens and should not be used as a sole basis for treatment. Nasal washings and aspirates are unacceptable for Xpert Xpress SARS-CoV-2/FLU/RSV testing.  Fact Sheet for Patients: BloggerCourse.com  Fact Sheet for Healthcare Providers: SeriousBroker.it  This test is not yet approved or cleared by the  [place] [employer] trader and has been authorized for detection and/or diagnosis of SARS-CoV-2 by FDA under an TEFL teacher (EUA). This EUA will remain in effect (meaning this test can be used) for the duration of the COVID-19 declaration under Section 564(b)(1) of the Act, 21 U.S.C. section 360bbb-3(b)(1), unless the authorization is terminated or revoked.  Performed at [facility], [address] [place], [place] [address]        Inpatient Medications:   Scheduled Meds: Continuous Infusions:   Radiological Exams on Admission: DG Chest 1 View  Result Date: 11/07/2020 CLINICAL DATA:  29 year old female with shortness of breath. EXAM: CHEST  1 VIEW COMPARISON:  Chest radiograph dated 06/28/2008  FINDINGS: There is cardiomegaly with vascular congestion and edema. Pneumonia is not excluded clinical correlation is recommended. There is no pleural effusion pneumothorax. No acute osseous pathology. IMPRESSION: Cardiomegaly with findings of CHF. Pneumonia is not excluded. Electronically Signed   By: Elgie Collard M.D.   On: 11/07/2020 19:04    Impression/Recommendations Principal Problem:   [redacted] weeks gestation of pregnancy Active Problems:   Gestational diabetes mellitus (GDM), antepartum   COVID-19 virus infection  1. [redacted] weeks gestation - 1. OB primary 2. Sounds like MFM getting involved as well 2. COVID-19 virus infection - 1. No O2 requirement at this time 2. COVID-19 pathway 3. Starting remdesivir 1. Doesn't qualify for MAB since admitted per pharmD 2. And we dont have sotrovimab in stock anyhow and the new omicrom variant is resistant to other MABs anyhow (omicrom accounted for 73% of all new infections in [place] as of 6 days ago). 4. Holding off on steroids and immunomodulator therapy for the moment given no O2 requirement at this time 5. Cont pulse ox 6. Checking CRP / procalcitonin / d.dimer will automatically be elevated in pregnancy anyhow 7. WBC nl 3. Concern for possible peripartum cardiomyopathy - 1. Cardiomegally can be normal in pregnancy 2. Cardiologist Dr. Joyce Gross however is concerned that BNP elevation is not normal 3. CXR = pulm edema vs infiltrate from the COVID, difficult to tell really at this point. 4. 2d echo in AM 5. Cards recd trying lasix tonight and echo in AM, will order 20mg  IV lasix x1 6. Possible formal cards consult after echo depending on findings   Thank you for this consultation.  Our [facility] hospitalist team will follow the patient with you.   Time Spent: 120 min  Hulda Reddix M. D.O. [facility] 11/07/2020, 10:01 PM

## 2020-11-07 NOTE — MAU Note (Signed)
[name] is a 29 y.o. at [redacted]w[redacted]d here in MAU reporting: contractions started today and also lost her mucus plug. Denies bleeding. ? LOF, states it has been clear and she has soaked through a pad. +FM  Onset of complaint: today  Pain score: 5/10  Vitals:   11/07/20 1740  BP: 121/66  Pulse: 91  Resp: 16  Temp: (!) 102 F (38.9 C)  SpO2: 97%     FHT: EFM applied  Lab orders placed from triage:

## 2020-11-07 NOTE — H&P (Signed)
[name] is a 29 y.o. female, G1P0 at 36.2 weeks, presenting for contractions, cough and fever.  Pt initiated prenatal care at [facility].  Pt denies bleeding or LOF. FM+  Pt reports feeling sick 2 days ago.  Has not been immunized to Covid. Pt has a history of depression and anxiety currently treated with Zoloft. She has gestational diabetes diet controlled.  Patient Active Problem List   Diagnosis Date Noted  . Gestational diabetes mellitus (GDM), antepartum 10/13/2020    History of present pregnancy: Patient entered care at 9 weeks.   EDC of 12/03/2020 was established by [place].   Anatomy scan: 20 weeks, with normal findings  OB History    Gravida  1   Para      Term      Preterm      AB      Living        SAB      IAB      Ectopic      Multiple      Live Births             Past Medical History:  Diagnosis Date  . Pseudotumor cerebri   . Trimalleolar fracture of ankle, closed 06/10/2017   right   Past Surgical History:  Procedure Laterality Date  . LUMBAR PUNCTURE  09/09/2002; 10/14/2002; 05/11/2004   under anesthesia  . ORIF ANKLE FRACTURE Right 06/15/2017   Procedure: OPEN REDUCTION INTERNAL FIXATION (ORIF)RIGHT TRIMALLEOLAR ANKLE FRACTURE WITHOUT POSTERIOR FIXATION;  Surgeon: Sheral Apley, MD;  Location: [facility];  Service: Orthopedics;  Laterality: Right;   Family History: family history includes Breast cancer in an other family member; Diabetes in her mother and another family member; Heart failure in an other family member. Social History:  reports that she has been smoking cigarettes. She has a 7.00 pack-year smoking history. She has never used smokeless tobacco. She reports current alcohol use. She reports that she does not use drugs.   Prenatal Transfer Tool  Maternal Diabetes: No  Maternal Ultrasounds/Referrals: Normal Fetal Ultrasounds or other Referrals:  None Maternal Substance Abuse:  No Significant Maternal  Medications:  None Significant Maternal Lab Results: None  Review of Systems  Constitutional: Positive for chills, fever and malaise/fatigue.  HENT: Negative.   Eyes: Negative.   Respiratory: Positive for cough.   Cardiovascular: Negative.   Gastrointestinal: Negative.   Genitourinary: Negative.   Musculoskeletal: Positive for myalgias.  Skin: Negative.   Neurological: Negative.   Endo/Heme/Allergies: Negative.   Psychiatric/Behavioral: The patient is nervous/anxious.     Allergies  Allergen Reactions  . Amoxicillin Rash  . Bactrim [Sulfamethoxazole-Trimethoprim] Rash   Results for orders placed or performed during the hospital encounter of 11/07/20 (from the past 24 hour(s))  Resp Panel by RT-PCR (Flu A&B, Covid) Nasopharyngeal Swab     Status: Abnormal   Collection Time: 11/07/20  6:13 PM   Specimen: Nasopharyngeal Swab; Nasopharyngeal(NP) swabs in vial transport medium  Result Value Ref Range   SARS Coronavirus 2 by RT PCR POSITIVE (A) NEGATIVE   Influenza A by PCR NEGATIVE NEGATIVE   Influenza B by PCR NEGATIVE NEGATIVE  Brain natriuretic peptide     Status: Abnormal   Collection Time: 11/07/20  6:13 PM  Result Value Ref Range   B Natriuretic Peptide 130.9 (H) 0.0 - 100.0 pg/mL  CBC with Differential/Platelet     Status: Abnormal   Collection Time: 11/07/20  6:13 PM  Result Value Ref Range  WBC 6.8 4.0 - 10.5 K/uL   RBC 3.74 (L) 3.87 - 5.11 MIL/uL   Hemoglobin 11.7 (L) 12.0 - 15.0 g/dL   HCT 13.2 (L) 44.0 - 10.2 %   MCV 89.6 80.0 - 100.0 fL   MCH 31.3 26.0 - 34.0 pg   MCHC 34.9 30.0 - 36.0 g/dL   RDW 72.5 36.6 - 44.0 %   Platelets 146 (L) 150 - 400 K/uL   nRBC 0.0 0.0 - 0.2 %   Neutrophils Relative % 90 %   Neutro Abs 6.1 1.7 - 7.7 K/uL   Lymphocytes Relative 7 %   Lymphs Abs 0.5 (L) 0.7 - 4.0 K/uL   Monocytes Relative 2 %   Monocytes Absolute 0.1 0.1 - 1.0 K/uL   Eosinophils Relative 0 %   Eosinophils Absolute 0.0 0.0 - 0.5 K/uL   Basophils Relative 0 %    Basophils Absolute 0.0 0.0 - 0.1 K/uL   Immature Granulocytes 1 %   Abs Immature Granulocytes 0.09 (H) 0.00 - 0.07 K/uL  Comprehensive metabolic panel     Status: Abnormal   Collection Time: 11/07/20  6:13 PM  Result Value Ref Range   Sodium 128 (L) 135 - 145 mmol/L   Potassium 3.5 3.5 - 5.1 mmol/L   Chloride 101 98 - 111 mmol/L   CO2 17 (L) 22 - 32 mmol/L   Glucose, Bld 92 70 - 99 mg/dL   BUN 6 6 - 20 mg/dL   Creatinine, Ser 3.47 0.44 - 1.00 mg/dL   Calcium 7.9 (L) 8.9 - 10.3 mg/dL   Total Protein 6.0 (L) 6.5 - 8.1 g/dL   Albumin 2.4 (L) 3.5 - 5.0 g/dL   AST 33 15 - 41 U/L   ALT 11 0 - 44 U/L   Alkaline Phosphatase 96 38 - 126 U/L   Total Bilirubin 0.7 0.3 - 1.2 mg/dL   GFR, Estimated >42 >59 mL/min   Anion gap 10 5 - 15  Troponin I (High Sensitivity)     Status: None   Collection Time: 11/07/20  6:13 PM  Result Value Ref Range   Troponin I (High Sensitivity) 14 <18 ng/L  POCT fern test     Status: None   Collection Time: 11/07/20  6:30 PM  Result Value Ref Range   POCT Fern Test Negative = intact amniotic membranes   CHEST  1 VIEW  COMPARISON:  Chest radiograph dated 06/28/2008  FINDINGS: There is cardiomegaly with vascular congestion and edema. Pneumonia is not excluded clinical correlation is recommended. There is no pleural effusion pneumothorax. No acute osseous pathology.  IMPRESSION: Cardiomegaly with findings of CHF. Pneumonia is not excluded.  Dilation: Closed Effacement (%): Thick Exam by:: hashem rn Blood pressure (!) 142/69, pulse 92, temperature 99.3 F (37.4 C), temperature source Oral, resp. rate (!) 26, SpO2 97 %.   . Chest clear Heart RRR without murmur Abd gravid, NT, FH appropriated  FHR: Category 2 FHT 120 with variable decels to 90s  UCs: 6 lasting 60 sec  Prenatal labs: ABO, Rh:   Antibody:   Rubella:    RPR:    HBsAg:    HIV:    GBS:   GC:   Chlamydia:   Glucola:  Other:   Hgb         Assessment/Plan: 29 yo G1P0  at 36.2 IUP with Covid Cat 2 strip R/O peripartum cardiomeagly  Plan: Admit to [facility] speciality care Routine [facility] orders Pain med/epidural prn Consult hospitalist Meds for Covid  per hospitalist Consult MFM [place] with BPP Lasix 20 mg IVP Heart echo in am Will obtain labs from [place] in am  Henderson Newcomer ProtheroCNM, MSN 11/07/2020, 8:27 PM

## 2020-11-07 NOTE — MAU Provider Note (Signed)
History     CSN: [id number]  Arrival date and time: 11/07/20 1716  Chief Complaint  Patient presents with  . Contractions  . Rupture of Membranes   29 y.o. G1 @36 .2 wks presenting with ctx and LOF. Reports onset of ctx this am. She lost her mucus plug and started having gushes of clear fluid. Reports +FM. Endorses productive cough, nasal congestion and SOB x2 days. Denies fever, chills, or myalgia. Denies known Covid exposure. She has not been vaccinated.   OB History    Gravida  1   Para      Term      Preterm      AB      Living        SAB      IAB      Ectopic      Multiple      Live Births              Past Medical History:  Diagnosis Date  . Pseudotumor cerebri   . Trimalleolar fracture of ankle, closed 06/10/2017   right    Past Surgical History:  Procedure Laterality Date  . LUMBAR PUNCTURE  09/09/2002; 10/14/2002; 05/11/2004   under anesthesia  . ORIF ANKLE FRACTURE Right 06/15/2017   Procedure: OPEN REDUCTION INTERNAL FIXATION (ORIF)RIGHT TRIMALLEOLAR ANKLE FRACTURE WITHOUT POSTERIOR FIXATION;  Surgeon: 08/15/2017, MD;  Location: [facility];  Service: Orthopedics;  Laterality: Right;    Family History  Problem Relation Age of Onset  . Diabetes Mother   . Diabetes Other   . Heart failure Other   . Breast cancer Other     Social History   Tobacco Use  . Smoking status: Current Every Day Smoker    Packs/day: 1.00    Years: 7.00    Pack years: 7.00    Types: Cigarettes  . Smokeless tobacco: Never Used  Vaping Use  . Vaping Use: Never used  Substance Use Topics  . Alcohol use: Yes    Comment: weekends  . Drug use: No    Allergies:  Allergies  Allergen Reactions  . Amoxicillin Rash  . Bactrim [Sulfamethoxazole-Trimethoprim] Rash    Medications Prior to Admission  Medication Sig Dispense Refill Last Dose  . aspirin EC 81 MG tablet Take 1 tablet (81 mg total) by mouth daily. For 30 days post op for DVT  prophylaxis. 30 tablet 0   . docusate sodium (COLACE) 100 MG capsule Take 1 capsule (100 mg total) by mouth 2 (two) times daily. To prevent constipation while taking pain medication. 60 capsule 0   . methocarbamol (ROBAXIN) 500 MG tablet Take 1 tablet (500 mg total) by mouth every 6 (six) hours as needed for muscle spasms. 40 tablet 0   . ondansetron (ZOFRAN) 4 MG tablet Take 1 tablet (4 mg total) by mouth every 8 (eight) hours as needed for nausea or vomiting. 40 tablet 0   . oxyCODONE-acetaminophen (ROXICET) 5-325 MG tablet Take 1-2 tablets by mouth every 4 (four) hours as needed for severe pain. 60 tablet 0     Review of Systems  Constitutional: Negative for chills and fever.  HENT: Positive for congestion. Negative for sore throat.   Respiratory: Positive for cough and shortness of breath.   Cardiovascular: Negative for chest pain.  Gastrointestinal: Positive for nausea. Negative for vomiting.  Genitourinary: Negative for vaginal bleeding.  Musculoskeletal: Positive for back pain.   Physical Exam   Blood pressure (!) 142/69, pulse 92,  temperature 99.3 F (37.4 C), temperature source Oral, resp. rate (!) 26, SpO2 97 %.  Physical Exam Vitals and nursing note reviewed. Exam conducted with a chaperone present.  Constitutional:      General: She is not in acute distress.    Appearance: Normal appearance.  HENT:     Head: Normocephalic and atraumatic.  Cardiovascular:     Rate and Rhythm: Normal rate and regular rhythm.     Heart sounds: Normal heart sounds.  Pulmonary:     Effort: Pulmonary effort is normal. No respiratory distress.     Breath sounds: No stridor. No wheezing, rhonchi or rales.  Abdominal:     Palpations: Abdomen is soft.     Tenderness: There is no abdominal tenderness.  Genitourinary:    Comments: SSE: no pool, fern neg SVE: closed/thick Musculoskeletal:        General: Normal range of motion.     Cervical back: Normal range of motion.  Skin:    General:  Skin is warm and dry.  Neurological:     General: No focal deficit present.     Mental Status: She is alert and oriented to person, place, and time.  Psychiatric:        Mood and Affect: Mood normal.        Behavior: Behavior normal.   EFM: 180 bpm, mod variability, no accels, variable decels Toco: irreg  Results for orders placed or performed during the hospital encounter of 11/07/20 (from the past 24 hour(s))  Resp Panel by RT-PCR (Flu A&B, Covid) Nasopharyngeal Swab     Status: Abnormal   Collection Time: 11/07/20  6:13 PM   Specimen: Nasopharyngeal Swab; Nasopharyngeal(NP) swabs in vial transport medium  Result Value Ref Range   SARS Coronavirus 2 by RT PCR POSITIVE (A) NEGATIVE   Influenza A by PCR NEGATIVE NEGATIVE   Influenza B by PCR NEGATIVE NEGATIVE  Brain natriuretic peptide     Status: Abnormal   Collection Time: 11/07/20  6:13 PM  Result Value Ref Range   B Natriuretic Peptide 130.9 (H) 0.0 - 100.0 pg/mL  CBC with Differential/Platelet     Status: Abnormal   Collection Time: 11/07/20  6:13 PM  Result Value Ref Range   WBC 6.8 4.0 - 10.5 K/uL   RBC 3.74 (L) 3.87 - 5.11 MIL/uL   Hemoglobin 11.7 (L) 12.0 - 15.0 g/dL   HCT 97.4 (L) 16.3 - 84.5 %   MCV 89.6 80.0 - 100.0 fL   MCH 31.3 26.0 - 34.0 pg   MCHC 34.9 30.0 - 36.0 g/dL   RDW 36.4 68.0 - 32.1 %   Platelets 146 (L) 150 - 400 K/uL   nRBC 0.0 0.0 - 0.2 %   Neutrophils Relative % 90 %   Neutro Abs 6.1 1.7 - 7.7 K/uL   Lymphocytes Relative 7 %   Lymphs Abs 0.5 (L) 0.7 - 4.0 K/uL   Monocytes Relative 2 %   Monocytes Absolute 0.1 0.1 - 1.0 K/uL   Eosinophils Relative 0 %   Eosinophils Absolute 0.0 0.0 - 0.5 K/uL   Basophils Relative 0 %   Basophils Absolute 0.0 0.0 - 0.1 K/uL   Immature Granulocytes 1 %   Abs Immature Granulocytes 0.09 (H) 0.00 - 0.07 K/uL  Comprehensive metabolic panel     Status: Abnormal   Collection Time: 11/07/20  6:13 PM  Result Value Ref Range   Sodium 128 (L) 135 - 145 mmol/L    Potassium 3.5 3.5 -  5.1 mmol/L   Chloride 101 98 - 111 mmol/L   CO2 17 (L) 22 - 32 mmol/L   Glucose, Bld 92 70 - 99 mg/dL   BUN 6 6 - 20 mg/dL   Creatinine, Ser 2.42 0.44 - 1.00 mg/dL   Calcium 7.9 (L) 8.9 - 10.3 mg/dL   Total Protein 6.0 (L) 6.5 - 8.1 g/dL   Albumin 2.4 (L) 3.5 - 5.0 g/dL   AST 33 15 - 41 U/L   ALT 11 0 - 44 U/L   Alkaline Phosphatase 96 38 - 126 U/L   Total Bilirubin 0.7 0.3 - 1.2 mg/dL   GFR, Estimated >35 >36 mL/min   Anion gap 10 5 - 15  Troponin I (High Sensitivity)     Status: None   Collection Time: 11/07/20  6:13 PM  Result Value Ref Range   Troponin I (High Sensitivity) 14 <18 ng/L  POCT fern test     Status: None   Collection Time: 11/07/20  6:30 PM  Result Value Ref Range   POCT Fern Test Negative = intact amniotic membranes    DG Chest 1 View  Result Date: 11/07/2020 CLINICAL DATA:  29 year old female with shortness of breath. EXAM: CHEST  1 VIEW COMPARISON:  Chest radiograph dated 06/28/2008 FINDINGS: There is cardiomegaly with vascular congestion and edema. Pneumonia is not excluded clinical correlation is recommended. There is no pleural effusion pneumothorax. No acute osseous pathology. IMPRESSION: Cardiomegaly with findings of CHF. Pneumonia is not excluded. Electronically Signed   By: Elgie Collard M.D.   On: 11/07/2020 19:04   MAU Course  Procedures LR Tylenol  MDM Labs ordered and reviewed. Fetal tachycardia and maternal fever upon admit. LR and Tylenol ordered. No signs of labor. Suspect respiratory illness. Dr. Donavan Foil notified of presentation and clinical findings, agrees with plan. 1945: +Covid and abnormal CXR, consult with Dr. Donavan Foil, plan for admit 1947: Dr. Normand Sloop notified of pt presentation and clinical findings, labs/imaging, and fetal tachycardia with variable and late decels, plan for admit.   Assessment and Plan   1. [redacted] weeks gestation of pregnancy   2. COVID-19 affecting pregnancy in third trimester   3. Fetal heart rate  decelerations affecting management of mother    Admit to [facility] unit [facility] per Dr. Maple Mirza, CNM 11/07/2020, 8:16 PM

## 2020-11-08 ENCOUNTER — Inpatient Hospital Stay (HOSPITAL_BASED_OUTPATIENT_CLINIC_OR_DEPARTMENT_OTHER): Payer: Medicaid Other

## 2020-11-08 DIAGNOSIS — O4703 False labor before 37 completed weeks of gestation, third trimester: Secondary | ICD-10-CM

## 2020-11-08 DIAGNOSIS — A4189 Other specified sepsis: Secondary | ICD-10-CM

## 2020-11-08 DIAGNOSIS — O98513 Other viral diseases complicating pregnancy, third trimester: Secondary | ICD-10-CM

## 2020-11-08 DIAGNOSIS — F419 Anxiety disorder, unspecified: Secondary | ICD-10-CM | POA: Diagnosis not present

## 2020-11-08 DIAGNOSIS — O24419 Gestational diabetes mellitus in pregnancy, unspecified control: Secondary | ICD-10-CM | POA: Diagnosis present

## 2020-11-08 DIAGNOSIS — U071 COVID-19: Secondary | ICD-10-CM | POA: Diagnosis not present

## 2020-11-08 DIAGNOSIS — O24410 Gestational diabetes mellitus in pregnancy, diet controlled: Secondary | ICD-10-CM | POA: Diagnosis not present

## 2020-11-08 DIAGNOSIS — O368330 Maternal care for abnormalities of the fetal heart rate or rhythm, third trimester, not applicable or unspecified: Secondary | ICD-10-CM

## 2020-11-08 DIAGNOSIS — A419 Sepsis, unspecified organism: Secondary | ICD-10-CM | POA: Diagnosis present

## 2020-11-08 DIAGNOSIS — Z3A36 36 weeks gestation of pregnancy: Secondary | ICD-10-CM | POA: Diagnosis not present

## 2020-11-08 DIAGNOSIS — O24415 Gestational diabetes mellitus in pregnancy, controlled by oral hypoglycemic drugs: Secondary | ICD-10-CM | POA: Diagnosis present

## 2020-11-08 DIAGNOSIS — I428 Other cardiomyopathies: Secondary | ICD-10-CM | POA: Diagnosis not present

## 2020-11-08 LAB — RAPID URINE DRUG SCREEN, HOSP PERFORMED
Amphetamines: NOT DETECTED
Barbiturates: NOT DETECTED
Benzodiazepines: NOT DETECTED
Cocaine: NOT DETECTED
Opiates: NOT DETECTED
Tetrahydrocannabinol: NOT DETECTED

## 2020-11-08 LAB — C-REACTIVE PROTEIN: CRP: 19.1 mg/dL — ABNORMAL HIGH (ref <1.0)

## 2020-11-08 LAB — PHOSPHORUS: Phosphorus: 2.6 mg/dL (ref 2.5–4.6)

## 2020-11-08 LAB — ECHOCARDIOGRAM COMPLETE
Area-P 1/2: 3.4 cm2
Calc EF: 65.7 %
Height: 64 in
S' Lateral: 3.1 cm
Single Plane A2C EF: 70.4 %
Single Plane A4C EF: 62.2 %
Weight: 4127.01 oz

## 2020-11-08 LAB — CBC WITH DIFFERENTIAL/PLATELET
Abs Immature Granulocytes: 0.07 10*3/uL (ref 0.00–0.07)
Basophils Absolute: 0 10*3/uL (ref 0.0–0.1)
Basophils Relative: 0 %
Eosinophils Absolute: 0 10*3/uL (ref 0.0–0.5)
Eosinophils Relative: 0 %
HCT: 31.3 % — ABNORMAL LOW (ref 36.0–46.0)
Hemoglobin: 10.7 g/dL — ABNORMAL LOW (ref 12.0–15.0)
Immature Granulocytes: 1 %
Lymphocytes Relative: 7 %
Lymphs Abs: 0.5 10*3/uL — ABNORMAL LOW (ref 0.7–4.0)
MCH: 30.3 pg (ref 26.0–34.0)
MCHC: 34.2 g/dL (ref 30.0–36.0)
MCV: 88.7 fL (ref 80.0–100.0)
Monocytes Absolute: 0.1 10*3/uL (ref 0.1–1.0)
Monocytes Relative: 2 %
Neutro Abs: 5.6 10*3/uL (ref 1.7–7.7)
Neutrophils Relative %: 90 %
Platelets: 125 10*3/uL — ABNORMAL LOW (ref 150–400)
RBC: 3.53 MIL/uL — ABNORMAL LOW (ref 3.87–5.11)
RDW: 14 % (ref 11.5–15.5)
WBC: 6.2 10*3/uL (ref 4.0–10.5)
nRBC: 0 % (ref 0.0–0.2)

## 2020-11-08 LAB — COMPREHENSIVE METABOLIC PANEL
ALT: 11 U/L (ref 0–44)
AST: 29 U/L (ref 15–41)
Albumin: 2.2 g/dL — ABNORMAL LOW (ref 3.5–5.0)
Alkaline Phosphatase: 85 U/L (ref 38–126)
Anion gap: 10 (ref 5–15)
BUN: 8 mg/dL (ref 6–20)
CO2: 17 mmol/L — ABNORMAL LOW (ref 22–32)
Calcium: 7.9 mg/dL — ABNORMAL LOW (ref 8.9–10.3)
Chloride: 104 mmol/L (ref 98–111)
Creatinine, Ser: 0.77 mg/dL (ref 0.44–1.00)
GFR, Estimated: >60 mL/min (ref >60)
Glucose, Bld: 114 mg/dL — ABNORMAL HIGH (ref 70–99)
Potassium: 3 mmol/L — ABNORMAL LOW (ref 3.5–5.1)
Sodium: 131 mmol/L — ABNORMAL LOW (ref 135–145)
Total Bilirubin: 0.4 mg/dL (ref 0.3–1.2)
Total Protein: 5.7 g/dL — ABNORMAL LOW (ref 6.5–8.1)

## 2020-11-08 LAB — ETHANOL: Alcohol, Ethyl (B): <10 mg/dL (ref <10)

## 2020-11-08 LAB — HEMOGLOBIN A1C
Hgb A1c MFr Bld: 5.9 % — ABNORMAL HIGH (ref 4.8–5.6)
Mean Plasma Glucose: 122.63 mg/dL

## 2020-11-08 LAB — FERRITIN: Ferritin: 123 ng/mL (ref 11–307)

## 2020-11-08 LAB — MAGNESIUM: Magnesium: 1.8 mg/dL (ref 1.7–2.4)

## 2020-11-08 LAB — HIV ANTIBODY (ROUTINE TESTING W REFLEX): HIV Screen 4th Generation wRfx: NONREACTIVE

## 2020-11-08 LAB — GLUCOSE, CAPILLARY
Glucose-Capillary: 102 mg/dL — ABNORMAL HIGH (ref 70–99)
Glucose-Capillary: 130 mg/dL — ABNORMAL HIGH (ref 70–99)
Glucose-Capillary: 92 mg/dL (ref 70–99)
Glucose-Capillary: 94 mg/dL (ref 70–99)
Glucose-Capillary: 185 mg/dL — ABNORMAL HIGH (ref 70–99)

## 2020-11-08 LAB — D-DIMER, QUANTITATIVE: D-Dimer, Quant: 1.91 ug/mL-FEU — ABNORMAL HIGH (ref 0.00–0.50)

## 2020-11-08 LAB — LACTATE DEHYDROGENASE: LDH: 329 U/L — ABNORMAL HIGH (ref 98–192)

## 2020-11-08 MED ORDER — LACTATED RINGERS IV BOLUS
250.0000 mL | Freq: Once | INTRAVENOUS | Status: AC
  Administered 2020-11-08: 250 mL via INTRAVENOUS

## 2020-11-08 MED ORDER — IPRATROPIUM-ALBUTEROL 20-100 MCG/ACT IN AERS
1.0000 | INHALATION_SPRAY | Freq: Four times a day (QID) | RESPIRATORY_TRACT | Status: DC
  Administered 2020-11-08 – 2020-11-11 (×13): 1 via RESPIRATORY_TRACT
  Filled 2020-11-08: qty 4

## 2020-11-08 MED ORDER — ZINC SULFATE 220 (50 ZN) MG PO CAPS
220.0000 mg | ORAL_CAPSULE | Freq: Every day | ORAL | Status: DC
  Administered 2020-11-08 – 2020-11-11 (×4): 220 mg via ORAL
  Filled 2020-11-08 (×4): qty 1

## 2020-11-08 MED ORDER — HEPARIN SODIUM (PORCINE) 10000 UNIT/ML IJ SOLN
10000.0000 [IU] | Freq: Two times a day (BID) | INTRAMUSCULAR | Status: DC
  Administered 2020-11-08: 10000 [IU] via SUBCUTANEOUS
  Filled 2020-11-08 (×2): qty 1

## 2020-11-08 MED ORDER — POTASSIUM CHLORIDE CRYS ER 20 MEQ PO TBCR
50.0000 meq | EXTENDED_RELEASE_TABLET | Freq: Once | ORAL | Status: AC
  Administered 2020-11-08: 50 meq via ORAL
  Filled 2020-11-08: qty 3

## 2020-11-08 MED ORDER — ASCORBIC ACID 500 MG PO TABS
500.0000 mg | ORAL_TABLET | Freq: Every day | ORAL | Status: DC
  Administered 2020-11-08 – 2020-11-10 (×3): 500 mg via ORAL
  Filled 2020-11-08 (×4): qty 1

## 2020-11-08 MED ORDER — SOD CITRATE-CITRIC ACID 500-334 MG/5ML PO SOLN
30.0000 mL | ORAL | Status: AC
  Administered 2020-11-09: 30 mL via ORAL
  Filled 2020-11-08: qty 15

## 2020-11-08 MED ORDER — LORAZEPAM 2 MG/ML IJ SOLN: 1.0000 mg | Freq: Three times a day (TID) | INTRAMUSCULAR | Status: DC | PRN

## 2020-11-08 MED ORDER — DEXAMETHASONE 6 MG PO TABS
6.0000 mg | ORAL_TABLET | Freq: Every day | ORAL | Status: DC
  Administered 2020-11-08: 6 mg via ORAL
  Filled 2020-11-08: qty 1

## 2020-11-08 MED ORDER — INSULIN ASPART 100 UNIT/ML ~~LOC~~ SOLN
0.0000 [IU] | SUBCUTANEOUS | Status: DC
  Administered 2020-11-08 – 2020-11-09 (×2): 3 [IU] via SUBCUTANEOUS
  Administered 2020-11-09: 2 [IU] via SUBCUTANEOUS
  Administered 2020-11-09: 3 [IU] via SUBCUTANEOUS
  Administered 2020-11-09: 2 [IU] via SUBCUTANEOUS
  Administered 2020-11-10: 3 [IU] via SUBCUTANEOUS
  Administered 2020-11-10: 2 [IU] via SUBCUTANEOUS
  Administered 2020-11-11: 3 [IU] via SUBCUTANEOUS

## 2020-11-08 MED ORDER — FUROSEMIDE 10 MG/ML IJ SOLN
40.0000 mg | Freq: Once | INTRAMUSCULAR | Status: AC
  Administered 2020-11-08: 40 mg via INTRAVENOUS
  Filled 2020-11-08: qty 4

## 2020-11-08 MED ORDER — SERTRALINE HCL 50 MG PO TABS
50.0000 mg | ORAL_TABLET | Freq: Every day | ORAL | Status: DC
  Administered 2020-11-08 – 2020-11-10 (×3): 50 mg via ORAL
  Filled 2020-11-08 (×3): qty 1

## 2020-11-08 MED ORDER — CEFAZOLIN SODIUM-DEXTROSE 2-4 GM/100ML-% IV SOLN
2.0000 g | INTRAVENOUS | Status: AC
  Administered 2020-11-09: 2 g via INTRAVENOUS
  Filled 2020-11-08 (×2): qty 100

## 2020-11-08 MED ORDER — LACTATED RINGERS IV SOLN: INTRAVENOUS | Status: DC

## 2020-11-08 MED ORDER — METHYLPREDNISOLONE SODIUM SUCC 125 MG IJ SOLR
60.0000 mg | Freq: Two times a day (BID) | INTRAMUSCULAR | Status: DC
  Administered 2020-11-08 – 2020-11-11 (×5): 60 mg via INTRAVENOUS
  Filled 2020-11-08 (×5): qty 2

## 2020-11-08 NOTE — Progress Notes (Signed)
PROGRESS NOTE    [name]  MEB:[id number] DOB: 1991/05/20 DOA: 11/07/2020 PCP: Patient, No Pcp Per     Brief Narrative:  [name] is an 29 y.o.WF PMHx pseudotumor cerebri, 36w gestation.  Gestational diabetes, tobacco abuse (discontinued for pregnancy).   Admitted to MAU for contractions, cough, fever.  Pt unvaccinated to COVID-19.  Started feeling ill 2 days ago.  Symptoms are progressively worsening, symptoms are constant.  Nothing makes better or worse.  WBC nl, COVID+, BNP 130, CXR shows cardiomegally and B pulmonary edema but "infection not excluded".  Hospitalist consulted.   Subjective: T-max overnight 39.1 C, A/O x4, slightly S OB, extremely upset.  Mother at bedside with daughter mask on.   Assessment & Plan: Covid vaccination; unvaccinated   Principal Problem:   Sepsis due to COVID-19 [facility]) Active Problems:   Gestational diabetes mellitus (GDM), antepartum   [redacted] weeks gestation of pregnancy   COVID-19 virus infection   Sepsis, unspecified organism ([place])   Gestational diabetes   Anxiety  Sepsis  -On admission patient met criteria for sepsis temp> 30C, HR> 90, site of infection lungs (on PCXR bilateral opacification)   COVID-19 infection COVID-19 Labs  Recent Labs    11/07/20 2231 11/08/20 0444 11/08/20 0824  DDIMER  --   --  1.91*  FERRITIN  --   --  123  LDH  --   --  329*  CRP 18.8* 19.1*  --     Lab Results  Component Value Date   SARSCOV2NAA POSITIVE (A) 11/07/2020  -Remdesivir per pharmacy protocol -Solu-Medrol 60 mg BID -Vitamins per Covid protocol -Continuous pulse ox.  Patient's inflammatory markers are climbing and patient beginning to become short of breath on room air.  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 -12/27 echocardiogram normal -Strict ins and  outs -Daily weight -Lasix IV 40 mg x 1  Anxiety -As needed Ativan     DVT prophylaxis: Subcu heparin Code Status: Full Family Communication: 12/27 mother at bedside for discussion of plan of care answered all questions Status is: Inpatient    Dispo: The patient is from: Home              Anticipated d/c is to: Home              Anticipated d/c date is: 1/6              Patient currently unstable      Consultants:    Procedures/Significant Events:  12/27 echocardiogram; normal   I have personally reviewed and interpreted all radiology studies and my findings are as above.  VENTILATOR SETTINGS: Room air 12/27 SPO2 94%   Cultures   Antimicrobials: Anti-infectives (From admission, onward)   Start     Dose/Rate Route Frequency Ordered Stop   11/08/20 1000  remdesivir 100 mg in sodium chloride 0.9 % 100 mL IVPB       "Followed by" Linked Group Details   100 mg 200 mL/hr over 30 Minutes Intravenous Daily 11/07/20 2242 11/12/20 0959   11/07/20 2330  remdesivir 200 mg in sodium chloride 0.9% 250 mL IVPB       "Followed by" Linked Group Details   200 mg 580 mL/hr over 30 Minutes Intravenous Once 11/07/20 2242 11/08/20 0036       Devices    LINES / TUBES:  Continuous Infusions: . lactated ringers 75 mL/hr at 11/08/20 1739  . remdesivir 100 mg in NS 100 mL 100 mg (11/08/20 1107)     Objective: Vitals:   11/08/20 1525 11/08/20 1528 11/08/20 1530 11/08/20 1551  BP:  135/66    Pulse:  86    Resp:  18  (!) 28  Temp:   (!) 100.9 F (38.3 C) (!) 100.6 F (38.1 C)  TempSrc:   Axillary Axillary  SpO2: 96% 96%    Weight:      Height:        Intake/Output Summary (Last 24 hours) at 11/08/2020 1743 Last data filed at 11/08/2020 1739 Gross per 24 hour  Intake 1600 ml  Output 800 ml  Net 800 ml   Filed Weights   11/07/20 2326  Weight: 117 kg    Examination:  General: A/O x4, positive acute respiratory distress Eyes: negative scleral  hemorrhage, negative anisocoria, negative icterus ENT: Negative Runny nose, negative gingival bleeding, Neck:  Negative scars, masses, torticollis, lymphadenopathy, JVD Lungs: tachypneic decreased breath sounds bilaterally (may also be secondary to patient's late-term pregnancy) without wheezes or crackles Cardiovascular: Cardiac without murmur gallop or rub normal S1 and S2 Abdomen: negative abdominal pain, ridley distended for late term gestation, positive soft, bowel sounds, no rebound, no ascites, no appreciable mass Extremities: No significant cyanosis, clubbing, or edema bilateral lower extremities Skin: Negative rashes, lesions, ulcers Psychiatric:  Negative depression, positive anxiety, negative fatigue, negative mania  Central nervous system:  Cranial nerves II through XII intact, tongue/uvula midline, all extremities muscle strength 5/5, sensation intact throughout, finger nose finger bilateral within normal limits, quick finger touch bilateral within normal limits, negative Romberg sign, heel to shin bilateral within normal limits, standing on 1 foot bilateral within normal limits, walking on tiptoes within normal limits, walking on heels within normal limits, negative dysarthria, negative expressive aphasia, negative receptive aphasia.  .     Data Reviewed: Care during the described time interval was provided by me .  I have reviewed this patient's available data, including medical history, events of note, physical examination, and all test results as part of my evaluation.  CBC: Recent Labs  Lab 11/07/20 1813 11/08/20 0444  WBC 6.8 6.2  NEUTROABS 6.1 5.6  HGB 11.7* 10.7*  HCT 33.5* 31.3*  MCV 89.6 88.7  PLT 146* 409*   Basic Metabolic Panel: Recent Labs  Lab 11/07/20 1813 11/08/20 0444 11/08/20 0824  NA 128* 131*  --   K 3.5 3.0*  --   CL 101 104  --   CO2 17* 17*  --   GLUCOSE 92 114*  --   BUN 6 8  --   CREATININE 0.76 0.77  --   CALCIUM 7.9* 7.9*  --   MG   --   --  1.8  PHOS  --   --  2.6   GFR: Estimated Creatinine Clearance: 131.6 mL/min (by C-G formula based on SCr of 0.77 mg/dL). Liver Function Tests: Recent Labs  Lab 11/07/20 1813 11/08/20 0444  AST 33 29  ALT 11 11  ALKPHOS 96 85  BILITOT 0.7 0.4  PROT 6.0* 5.7*  ALBUMIN 2.4* 2.2*   No results for input(s): LIPASE, AMYLASE in the last 168 hours. No results for input(s): AMMONIA in the last 168 hours. Coagulation Profile: No results for input(s): INR, PROTIME in the last 168 hours. Cardiac Enzymes: No results for input(s): CKTOTAL, CKMB, CKMBINDEX, TROPONINI in the last 168 hours. BNP (last 3 results)  No results for input(s): PROBNP in the last 8760 hours. HbA1C: Recent Labs    11/08/20 0444  HGBA1C 5.9*   CBG: Recent Labs  Lab 11/07/20 2342 11/08/20 0749 11/08/20 1012 11/08/20 1327 11/08/20 1713  GLUCAP 126* 102* 130* 92 94   Lipid Profile: No results for input(s): CHOL, HDL, LDLCALC, TRIG, CHOLHDL, LDLDIRECT in the last 72 hours. Thyroid Function Tests: No results for input(s): TSH, T4TOTAL, FREET4, T3FREE, THYROIDAB in the last 72 hours. Anemia Panel: Recent Labs    11/08/20 0824  FERRITIN 123   Sepsis Labs: Recent Labs  Lab 11/07/20 2231  PROCALCITON 0.61    Recent Results (from the past 240 hour(s))  Resp Panel by RT-PCR (Flu A&B, Covid) Nasopharyngeal Swab     Status: Abnormal   Collection Time: 11/07/20  6:13 PM   Specimen: Nasopharyngeal Swab; Nasopharyngeal(NP) swabs in vial transport medium  Result Value Ref Range Status   SARS Coronavirus 2 by RT PCR POSITIVE (A) NEGATIVE Final    Comment: RESULT CALLED TO, READ BACK BY AND VERIFIED WITH: RN Jimmy Footman [phone] [phone] (NOTE) SARS-CoV-2 target nucleic acids are DETECTED.  The SARS-CoV-2 RNA is generally detectable in upper respiratory specimens during the acute phase of infection. Positive results are indicative of the presence of the identified virus, but do not rule out bacterial  infection or co-infection with other pathogens not detected by the test. Clinical correlation with patient history and other diagnostic information is necessary to determine patient infection status. The expected result is Negative.  Fact Sheet for Patients: EntrepreneurPulse.com.au  Fact Sheet for Healthcare Providers: IncredibleEmployment.be  This test is not yet approved or cleared by the [place] FDA and  has been authorized for detection and/or diagnosis of SARS-CoV-2 by FDA under an Emergency Use Authorization (EUA).  This EUA will remain in effect (meaning this test can be used)  for the duration of  the COVID-19 declaration under Section 564(b)(1) of the Act, 21 U.S.C. section 360bbb-3(b)(1), unless the authorization is terminated or revoked sooner.     Influenza A by PCR NEGATIVE NEGATIVE Final   Influenza B by PCR NEGATIVE NEGATIVE Final    Comment: (NOTE) The Xpert Xpress SARS-CoV-2/FLU/RSV plus assay is intended as an aid in the diagnosis of influenza from Nasopharyngeal swab specimens and should not be used as a sole basis for treatment. Nasal washings and aspirates are unacceptable for Xpert Xpress SARS-CoV-2/FLU/RSV testing.  Fact Sheet for Patients: EntrepreneurPulse.com.au  Fact Sheet for Healthcare Providers: IncredibleEmployment.be  This test is not yet approved or cleared by the [place] FDA and has been authorized for detection and/or diagnosis of SARS-CoV-2 by FDA under an Emergency Use Authorization (EUA). This EUA will remain in effect (meaning this test can be used) for the duration of the COVID-19 declaration under Section 564(b)(1) of the Act, 21 U.S.C. section 360bbb-3(b)(1), unless the authorization is terminated or revoked.  Performed at [facility], [place] [address], [place], [place] [address]          Radiology Studies: DG Chest 1 View  Result  Date: 11/07/2020 CLINICAL DATA:  29 year old female with shortness of breath. EXAM: CHEST  1 VIEW COMPARISON:  Chest radiograph dated 06/28/2008 FINDINGS: There is cardiomegaly with vascular congestion and edema. Pneumonia is not excluded clinical correlation is recommended. There is no pleural effusion pneumothorax. No acute osseous pathology. IMPRESSION: Cardiomegaly with findings of CHF. Pneumonia is not excluded. Electronically Signed   By: Anner Crete M.D.   On: 11/07/2020  19:04   [place] MFM FETAL BPP WO NON STRESS  Result Date: 11/08/2020 ----------------------------------------------------------------------  OBSTETRICS REPORT                        (Signed Final 11/08/2020 08:44 am) ---------------------------------------------------------------------- Patient Info  ID #:       [id number]                          D.O.B.:  06/04/1991 (29 yrs)  Name:       [name]                 Visit Date: 11/08/2020 01:56 am ---------------------------------------------------------------------- Performed By  Attending:        Sander Nephew      Ref. Address:      [id number]                                                              [place]                                                              [place]  Performed By:     Dorena Dew     Secondary Phy.:    [facility]                    BS, RDMS                                                              Care  Referred By:      Pleas Koch             [facility]                    [place] ---------------------------------------------------------------------- Orders  #  Description                           Code        Ordered By  1  [place] MFM FETAL BPP WO NON  16109.60    NANCY PROTHERO     STRESS  ----------------------------------------------------------------------  #  Order #                     Accession #                Episode #  1  [id number]                   [id number]                 [id number] ---------------------------------------------------------------------- Indications  Medical complication of pregnancy (Covid +      O26.90  11-07-20)  [redacted] weeks gestation of pregnancy                 Z3A.36  Preterm contractions                            O47.00  Fetal heart rate decelerations affecting        O36.8390  management of mother ---------------------------------------------------------------------- Fetal Evaluation  Num Of Fetuses:          1  Fetal Heart Rate(bpm):   171  Cardiac Activity:        Observed  Presentation:            Cephalic  Amniotic Fluid  AFI FV:      Within normal limits  AFI Sum(cm)     %Tile       Largest Pocket(cm)  16.9            63          5.2  RUQ(cm)       RLQ(cm)       LUQ(cm)        LLQ(cm)  3.9           3.5           4.3            5.2 ---------------------------------------------------------------------- Biophysical Evaluation  Amniotic F.V:   Within normal limits       F. Tone:         Observed  F. Movement:    Observed                   Score:           8/8  F. Breathing:   Observed ---------------------------------------------------------------------- OB History  Gravidity:    1         Term:   0        Prem:   0        SAB:   0  TOP:          0       Ectopic:  0        Living: 0 ---------------------------------------------------------------------- Gestational Age  Clinical EDD:  36w 3d                                        EDD:   12/03/20  Best:          36w 3d     Det. By:  Clinical EDD             EDD:   12/03/20 ---------------------------------------------------------------------- Impression  Antenatal testing due to variable decelerations seen on a  reactive tracing  Biophysical  profile 8/8 with good fetal movement and  amniotic fluid volume.  ---------------------------------------------------------------------- Recommendations  Follow up as clinically indicated. ----------------------------------------------------------------------               Sander Nephew, MD Electronically Signed Final Report   11/08/2020 08:44 am ----------------------------------------------------------------------  ECHOCARDIOGRAM COMPLETE  Result Date: 11/08/2020    ECHOCARDIOGRAM REPORT   Patient Name:   [name] Date of Exam: 11/08/2020 Medical Rec #:  [id number]       Height:       64.0 in Accession #:    [id number]      Weight:       257.9 lb Date of Birth:  1991-07-30       BSA:          2.180 m Patient Age:    29 years        BP:           116/69 mmHg Patient Gender: F               HR:           79 bpm. Exam Location:  Inpatient Procedure: 2D Echo, Cardiac Doppler and Color Doppler Indications:    I42.9 Cardiomyopathy (unspecified)  History:        Patient has no prior history of Echocardiogram examinations.                 Signs/Symptoms:Dyspnea and Shortness of Breath; Risk                 Factors:Diabetes. Covid positive. [redacted] weeks pregnant.  Sonographer:    Roseanna Rainbow RDCS Referring Phys: [phone] JARED M GARDNER  Sonographer Comments: Technically difficult study due to poor echo windows and patient is morbidly obese. Image acquisition challenging due to patient body habitus. IMPRESSIONS  1. Left ventricular ejection fraction, by estimation, is 65 to 70%. Left ventricular ejection fraction by 2D MOD biplane is 65.7 %. The left ventricle has hyperdynamic function. The left ventricle has no regional wall motion abnormalities. There is mild  concentric left ventricular hypertrophy. Left ventricular diastolic parameters were normal.  2. Right ventricular systolic function is normal. The right ventricular size is normal.  3. The mitral valve is grossly normal. No evidence of mitral valve regurgitation.  4. The aortic valve is grossly normal. Aortic valve  regurgitation is not visualized. No aortic stenosis is present.  5. The inferior vena cava is normal in size with greater than 50% respiratory variability, suggesting right atrial pressure of 3 mmHg. Comparison(s): No prior Echocardiogram. FINDINGS  Left Ventricle: Left ventricular ejection fraction, by estimation, is 65 to 70%. Left ventricular ejection fraction by 2D MOD biplane is 65.7 %. The left ventricle has hyperdynamic function. The left ventricle has no regional wall motion abnormalities. The left ventricular internal cavity size was normal in size. There is mild concentric left ventricular hypertrophy. Left ventricular diastolic parameters were normal. Right Ventricle: The right ventricular size is normal. No increase in right ventricular wall thickness. Right ventricular systolic function is normal. Left Atrium: Left atrial size was normal in size. Right Atrium: Right atrial size was normal in size. Pericardium: Trivial pericardial effusion is present. Mitral Valve: The mitral valve is grossly normal. No evidence of mitral valve regurgitation. Tricuspid Valve: The tricuspid valve is grossly normal. Tricuspid valve regurgitation is not demonstrated. Aortic Valve: The aortic valve is grossly normal. Aortic valve regurgitation is not visualized. No aortic stenosis is present. Pulmonic Valve: The pulmonic valve was not  well visualized. Pulmonic valve regurgitation is not visualized. Aorta: The aortic root and ascending aorta are structurally normal, with no evidence of dilitation. Venous: The pulmonary veins were not well visualized. The inferior vena cava is normal in size with greater than 50% respiratory variability, suggesting right atrial pressure of 3 mmHg. IAS/Shunts: The atrial septum is grossly normal.  LEFT VENTRICLE PLAX 2D                        Biplane EF (MOD) LVIDd:         4.70 cm         LV Biplane EF:   Left LVIDs:         3.10 cm                          ventricular LV PW:         1.20 cm                           ejection LV IVS:        1.00 cm                          fraction by LVOT diam:     1.90 cm                          2D MOD LV SV:         81                               biplane is LV SV Index:   37                               65.7 %. LVOT Area:     2.84 cm                                Diastology                                LV e' medial:    5.66 cm/s LV Volumes (MOD)               LV E/e' medial:  9.2 LV vol d, MOD    96.4 ml       LV e' lateral:   11.65 cm/s A2C:                           LV E/e' lateral: 4.5 LV vol d, MOD    80.2 ml A4C: LV vol s, MOD    28.5 ml A2C: LV vol s, MOD    30.3 ml A4C: LV SV MOD A2C:   67.9 ml LV SV MOD A4C:   80.2 ml LV SV MOD BP:    60.5 ml RIGHT VENTRICLE             IVC RV S prime:     14.80 cm/s  IVC diam: 1.40 cm TAPSE (M-mode): 2.6 cm LEFT ATRIUM  Index       RIGHT ATRIUM           Index LA diam:        2.50 cm 1.15 cm/m  RA Area:     11.70 cm LA Vol (A2C):   24.1 ml 11.06 ml/m RA Volume:   23.70 ml  10.87 ml/m LA Vol (A4C):   17.5 ml 8.03 ml/m LA Biplane Vol: 20.4 ml 9.36 ml/m  AORTIC VALVE LVOT Vmax:   131.00 cm/s LVOT Vmean:  96.000 cm/s LVOT VTI:    0.286 m  AORTA Ao Root diam: 3.30 cm Ao Asc diam:  3.10 cm MITRAL VALVE MV Area (PHT): 3.40 cm    SHUNTS MV Decel Time: 223 msec    Systemic VTI:  0.29 m MV E velocity: 52.16 cm/s  Systemic Diam: 1.90 cm MV A velocity: 77.60 cm/s MV E/A ratio:  0.67 Rudean Haskell MD Electronically signed by Rudean Haskell MD Signature Date/Time: 11/08/2020/12:00:13 PM    Final         Scheduled Meds: . vitamin C  500 mg Oral Daily  . docusate sodium  100 mg Oral Daily  . furosemide  40 mg Intravenous Once  . heparin injection (subcutaneous)  10,000 Units Subcutaneous Q12H  . insulin aspart  0-15 Units Subcutaneous Q4H  . Ipratropium-Albuterol  1 puff Inhalation Q6H  . methylPREDNISolone (SOLU-MEDROL) injection  60 mg Intravenous Q12H  . prenatal multivitamin  1 tablet  Oral Q1200  . sertraline  50 mg Oral QHS  . zinc sulfate  220 mg Oral Daily   Continuous Infusions: . lactated ringers 75 mL/hr at 11/08/20 1739  . remdesivir 100 mg in NS 100 mL 100 mg (11/08/20 1107)     LOS: 0 days    Time spent:40 min    Carina Chaplin, Geraldo Docker, MD [facility] Pager [phone]  If 7PM-7AM, please contact night-coverage [email or web address] Password [email or web address] 11/08/2020, 5:43 PM

## 2020-11-08 NOTE — Progress Notes (Signed)
Subjective: Called by RN to evaluate strip on patient.    Objective: BP 119/66 (BP Location: Left Arm)   Pulse 72   Temp 98.3 F (36.8 C) (Oral)   Resp (!) 24   Ht 5\' 4"  (1.626 m)   Wt 117 kg   SpO2 94%   BMI 44.27 kg/m  No intake/output data recorded. No intake/output data recorded. Vitals with BMI 11/07/2020 11/07/2020 11/07/2020  Height - 5\' 4"  -  Weight - 257 lbs 15 oz -  BMI - 44.25 -  Systolic 119 - 134  Diastolic 66 - 69  Pulse 72 - 76  Temp 102 FHT: Category 2 FHT BL 180s variability present variable decels  UC:   none SVE:   Dilation: Closed Effacement (%): Thick Exam by:: hashem rn Assessment:  29 yo G1P0 at 36.2 IUP with Covid Cat 2 strip R/O peripartum cardiomeagly  Plan: Monitor strip  Tylenol for fever Dr. [id number] aware of patient status  26 CNM, MSN 11/08/2020, 6:38 AM

## 2020-11-08 NOTE — Progress Notes (Signed)
  Echocardiogram 2D Echocardiogram has been performed.  [name] 11/08/2020, 9:13 AM

## 2020-11-08 NOTE — Progress Notes (Addendum)
[name] is a 29 y.o.  G1P0 at [redacted]w[redacted]d by LMP admitted for management of covid 19 infection. Presented with contractions however cervix was closed. Fetal tracing alternating between Category 1 and 2  Subjective: Patient reports congestion and sob/ Febrile and sweating. +FM continued contractions but irregular No vaginal bleeding no lof . She reports intermittent headache. No visual disturbance or ruq pain.   Objective: BP 116/69   Pulse 91   Temp (!) 101.9 F (38.8 C)   Resp (!) 22   Ht 5\' 4"  (1.626 m)   Wt 117 kg   SpO2 94%   BMI 44.27 kg/m  No intake/output data recorded. No intake/output data recorded.  FHT:  FHR: 170-180 bpm, variability: moderate,  accelerations:  Present,  decelerations:  Present variable and occasional late  UC:   Irregular  SVE:   Dilation: Closed Effacement (%): Thick Exam by:: hashem rn  Labs: Lab Results  Component Value Date   WBC 6.2 11/08/2020   HGB 10.7 (L) 11/08/2020   HCT 31.3 (L) 11/08/2020   MCV 88.7 11/08/2020   PLT 125 (L) 11/08/2020    Assessment / Plan: 36 wks and 3 days with Covid 19 infection  - Followed by hospitalist currently on Remdesiver started 11/08/2020  - pt encouraged to use incentive spirometer ever 30 minutes ot 1 hour while awake  - encouraged to ambulate in room  - encouraged to sit in chair   Latent labor - no active labor at present   Fetal Well Being- alternating between Category 1 and 2 tracing. . When pt is febrile category 2 and then resolves will monitor closely. BPP this AM 8/8  - will request Maternal Fetal Medicine Consult   A!DM - carb modified diet check FSBS fasting and 2 hour postprandial   Cardiomegaly on chest xray concern for CHF- this is unlikely however Echo scheduled for this AM  11/10/2020 11/08/2020, 8:34 AM

## 2020-11-08 NOTE — Progress Notes (Signed)
OBGYN Progress Note  Received call from primary RN that patient's mother called out with concern for altered mental status.  Stated that the patient seemed confused.  Primary RN called patient's name multiple times before she responded.  Also, there was concern for recurrent decelerations on FHT.  Upon my arrival to the room, patient was alert and oriented x 3.  She was resting in bed.  2L O2 nasal cannula in place without increased work of breathing.  O2 saturations at 93% or greater.  No desaturations.  Over the course of the evening, FHT with multiple spontaneous decelerations 2-4 minutes and variable decelerations.  Variability moderate in between.    FHT reviewed with Dr. Judeth Cornfield, MFM who agrees that the fetal heart tracing is non-reassuring and appears concerning despite 8/8 BPP today.  I discussed recommendation with patient to proceed with delivery for fetal indication.  We discussed risks/benfits of induction of labor with pitocin vs primary CS.  After carefully considering her options, patient opts for primary CS as she does not feel strong enough to endure labor and does not desire to risk further fetal distress with induction of labor.    Last meal and last Heparin dose around 1500 today.  Will wait at least 8 hours NPO, per anesthesia.  Ancef and Bicitra ordered on call to OR. OR charge, anesthesia, and primary RN aware of plan of care.  Will initiate prophylactic Lovenox postpartum.  Per Dr. Judeth Cornfield, due to current steroid administration for COVID treatment, no need for additional steroids at this time.  I have explained to the patient that this surgery is performed to deliver their baby or babies through an incision in the abdomen and incision in the uterus.  Prior to surgery, the risks and benefits of the surgery, as well as alternative treatments were discussed.  The risks include, but are not limited to, possible need for cesarean delivery for all future pregnancies, bleeding at the time  of surgery that could necessitate a blood transfusion and/or hysterectomy, rupture of the uterus during a future pregnancy that could cause a preterm delivery and/or requiring hysterectomy, infection, damage to surrounding organs and tissues, damage to bladder, damage to ureters, causing kidney damage, and requiring additional procedures, damage to bowels, resulting in further surgery, postoperative pain, short-term and long-term, scarring on the abdominal wall and intra-abdominally, need for further surgery, development of an incisional hernia, deep vein thrombosis and/or pulmonary embolism, wound infection and/or separation, painful intercourse, urinary leakage, impact on future pregnancies including but not limited to, abnormal location or attachment of the placenta to the uterus, such as placenta previa or accreta, that may necessitate a blood transfusion and/or hysterectomy, impact on total family size, complications the course of which cannot be predicted or prevented, and death. Patient was consented for blood products.  The patient is aware that bleeding may result in the need for a blood transfusion which includes risk of transmission of HIV (1:2 million), Hepatitis C (1:2 million), and Hepatitis B (1:200 thousand) and transfusion reaction.  Patient voiced understanding of the above risks as well as understanding of indications for blood transfusion.  Steva Ready, DO

## 2020-11-08 NOTE — Anesthesia Preprocedure Evaluation (Addendum)
Anesthesia Evaluation  Patient identified by MRN, date of birth, ID band Patient awake    Reviewed: NPO status , Patient's Chart, lab work & pertinent test results  Airway Mallampati: II  TM Distance: >3 FB Neck ROM: Full    Dental  (+) Teeth Intact   Pulmonary Current Smoker,  COVID-19 +   Pulmonary exam normal        Cardiovascular negative cardio ROS   Rhythm:Regular Rate:Normal     Neuro/Psych Anxiety negative neurological ROS     GI/Hepatic negative GI ROS, Neg liver ROS,   Endo/Other  negative endocrine ROS  Renal/GU negative Renal ROS  negative genitourinary   Musculoskeletal negative musculoskeletal ROS (+)   Abdominal (+)  Abdomen: soft. Bowel sounds: normal.  Peds  Hematology negative hematology ROS (+)   Anesthesia Other Findings   Reproductive/Obstetrics (+) Pregnancy                           Anesthesia Physical Anesthesia Plan  ASA: II  Anesthesia Plan: Spinal   Post-op Pain Management:    Induction:   PONV Risk Score and Plan: 1 and Ondansetron, Dexamethasone and Treatment may vary due to age or medical condition  Airway Management Planned: Simple Face Mask, Natural Airway and Nasal Cannula  Additional Equipment: None  Intra-op Plan:   Post-operative Plan:   Informed Consent: I have reviewed the patients History and Physical, chart, labs and discussed the procedure including the risks, benefits and alternatives for the proposed anesthesia with the patient or authorized representative who has indicated his/her understanding and acceptance.     Dental advisory given  Plan Discussed with: CRNA  Anesthesia Plan Comments: (Lab Results      Component                Value               Date                      WBC                      6.2                 11/08/2020                HGB                      10.7 (L)            11/08/2020                HCT                       31.3 (L)            11/08/2020                MCV                      88.7                11/08/2020                PLT                      125 (L)  11/08/2020           Covid-19 Nucleic Acid Test Results Lab Results      Component                Value               Date                      SARSCOV2NAA              POSITIVE (A)        11/07/2020           Lab Results      Component                Value               Date                      NA                       131 (L)             11/08/2020                K                        3.0 (L)             11/08/2020                CO2                      17 (L)              11/08/2020                GLUCOSE                  114 (H)             11/08/2020                BUN                      8                   11/08/2020                CREATININE               0.77                11/08/2020                CALCIUM                  7.9 (L)             11/08/2020                GFRNONAA                 >60                 11/08/2020                GFRAA                    >  60                 01/01/2016          )        Anesthesia Quick Evaluation

## 2020-11-08 NOTE — Consult Note (Addendum)
Maternal-Fetal Medicine (Telephone Consultation)  Name: [name] DOB: 15-Oct-1991 MRN: [id number] Referring Provider: Gerald Leitz, MD  Ms. Mena Goes P0 at 36w 3d gestation is admitted with COVID-19 infection.    Patient reported to [facility] yesterday with complaints of mild abdominal pain and uterine contractions.  She also suspected leakage of amniotic fluid.  On admission, will screen for SARS-CoV-2 that later turned out to be positive.  NST showed decelerations, which improved.  Patient also had fever.  On vaginal examination at admission, the cervix was closed.  Before admission, patient reports she felt very weak.  No history of shortness of breath.  She has been having headache off and on (history of idiopathic intracranial hypertension), no visual disturbances.  No chest pain or palpitations.  She had nausea and vomiting this morning.  No history of joint pains no history of vaginal bleeding or recurrent urinary tract infections.  Since admission, she is receiving remdesivir infusions.  Fever is being controlled by acetaminophen. Oxygen saturation ranges 93 to 98% mostly in the patient is on 2 L oxygen nasal cannula.   Chest x-ray showed cardiomegaly.  Subsequently, 2D echo showed normal ejection fraction (65 to 70%) and no structural heart anomalies.  Labs including liver enzymes, creatinine and platelets are within normal range.  SARS-CoV-2 is positive.  Influenza AMB are negative.  Urine toxicology is negative.  CRP is elevated. Biophysical profile was performed yesterday.  Normal amniotic fluid with reassuring antenatal testing.  BPP 8/8.  Past medical history: Idiopathic intracranial hypertension.  Patient reports she had multiple spinal taps.  She was not taking acetazolamide. Past surgical history: Ankle surgery. Allergies: Amoxicillin and Bactrim (rashes). Social history: She is single and lives with her partner who is Caucasian and he is in good health.  She is to smoke before  pregnancy.  No history of tobacco or alcohol or drug use in pregnancy.  Prenatal course: Patient is unsure about screening for aneuploidies.  Midtrimester fetal anatomy scan is reportedly normal.  She has gestational diabetes that is well controlled on diet.  Our concerns include Covid infection in pregnancy -Patient was comfortable without shortness of breath while talking on the phone.  She, however, require supplemental oxygen.  -She had fever with T-max 102.4 F that responded to acetaminophen.  Her blood pressures are within normal range.  -Labs including liver enzymes are within normal range.  -Patient has mild to moderate severe Covid infection.  I reassured her of normal echocardiography.  -Chest x-ray raised the possibility of pneumonia that could progress.  -I counseled the patient on Covid infection in pregnancy.  Primary concern is her respiratory status and if supportive mea-I discussed heparin prophylaxis and dexamethasone treatment.   -According to [facility] for Maternal Fetal Care guidelines, patients were on supplemental oxygen should receive dexamethasone 6 mg daily for a total of 10 days or until discharge.  -Pregnant women are at higher risk to develop thrombosis during hospitalization.  I recommend unfractionated heparin prophylaxis 10,000 units twice daily.  -I discussed the indications for delivery, which include deterioration of maternal respiratory status or abnormal labs or nonreassuring fetal heart trace.  -COVID-19 infection carries a high risk for cesarean delivery.  If patient's clinical condition is stable, strongly recommend vaginal delivery.  -A small increase in stillbirth is seen in pregnant women with Covid infection.  I recommend weekly BPP till delivery.  -Recent Society of Maternal-Fetal Medicine Guidelines support treatment of pregnant women with Paxlovid (nirmatrlvir and ritonavir tablets) if they meet  the clinical criteria.  -No emergency  treatment should be withheld because of her pregnancy.  Currently, the patient seems stable.  We will continue supportive measures.  If she does not require supplemental oxygen and is afebrile for at least 24 hours, discharge may be considered.   Recommendations: -Continue current management with discussion with Internal Medicine team. -Dexamethasone 6 mg PO daily for 10 days or till discharge. -Heparin 10,000 units subcutaneously twice daily till discharge. -Continuous NST for now. If reassuring, intermittent monitoring to be considered. -Indication for delivery is individualized and MFM team is available for consultation. -Weekly BPP till delivery.  Telephone consultation 30 minutes.

## 2020-11-09 ENCOUNTER — Inpatient Hospital Stay (HOSPITAL_COMMUNITY): Payer: Medicaid Other | Admitting: Anesthesiology

## 2020-11-09 ENCOUNTER — Encounter (HOSPITAL_COMMUNITY)
Admission: AD | Disposition: A | Discharge status: Home / Self Care | Payer: Self-pay | Source: Home / Self Care | Attending: Obstetrics and Gynecology

## 2020-11-09 ENCOUNTER — Encounter (HOSPITAL_COMMUNITY): Payer: Self-pay | Admitting: Anesthesiology

## 2020-11-09 DIAGNOSIS — O24410 Gestational diabetes mellitus in pregnancy, diet controlled: Secondary | ICD-10-CM | POA: Diagnosis not present

## 2020-11-09 DIAGNOSIS — U071 COVID-19: Secondary | ICD-10-CM | POA: Diagnosis present

## 2020-11-09 DIAGNOSIS — A4189 Other specified sepsis: Secondary | ICD-10-CM | POA: Diagnosis present

## 2020-11-09 DIAGNOSIS — F419 Anxiety disorder, unspecified: Secondary | ICD-10-CM | POA: Diagnosis present

## 2020-11-09 DIAGNOSIS — Z3A36 36 weeks gestation of pregnancy: Secondary | ICD-10-CM | POA: Diagnosis not present

## 2020-11-09 DIAGNOSIS — O9952 Diseases of the respiratory system complicating childbirth: Secondary | ICD-10-CM | POA: Diagnosis present

## 2020-11-09 DIAGNOSIS — J1282 Pneumonia due to coronavirus disease 2019: Secondary | ICD-10-CM | POA: Diagnosis present

## 2020-11-09 DIAGNOSIS — O24420 Gestational diabetes mellitus in childbirth, diet controlled: Secondary | ICD-10-CM | POA: Diagnosis present

## 2020-11-09 DIAGNOSIS — R4182 Altered mental status, unspecified: Secondary | ICD-10-CM | POA: Diagnosis not present

## 2020-11-09 DIAGNOSIS — F17210 Nicotine dependence, cigarettes, uncomplicated: Secondary | ICD-10-CM | POA: Diagnosis present

## 2020-11-09 DIAGNOSIS — I517 Cardiomegaly: Secondary | ICD-10-CM | POA: Diagnosis present

## 2020-11-09 DIAGNOSIS — O98513 Other viral diseases complicating pregnancy, third trimester: Secondary | ICD-10-CM | POA: Diagnosis not present

## 2020-11-09 DIAGNOSIS — O99344 Other mental disorders complicating childbirth: Secondary | ICD-10-CM | POA: Diagnosis present

## 2020-11-09 DIAGNOSIS — O9852 Other viral diseases complicating childbirth: Secondary | ICD-10-CM | POA: Diagnosis present

## 2020-11-09 DIAGNOSIS — J9621 Acute and chronic respiratory failure with hypoxia: Secondary | ICD-10-CM | POA: Diagnosis not present

## 2020-11-09 DIAGNOSIS — O99334 Smoking (tobacco) complicating childbirth: Secondary | ICD-10-CM | POA: Diagnosis present

## 2020-11-09 DIAGNOSIS — O99214 Obesity complicating childbirth: Secondary | ICD-10-CM | POA: Diagnosis present

## 2020-11-09 DIAGNOSIS — F32A Depression, unspecified: Secondary | ICD-10-CM | POA: Diagnosis present

## 2020-11-09 DIAGNOSIS — J9601 Acute respiratory failure with hypoxia: Secondary | ICD-10-CM | POA: Diagnosis not present

## 2020-11-09 DIAGNOSIS — R059 Cough, unspecified: Secondary | ICD-10-CM | POA: Diagnosis present

## 2020-11-09 DIAGNOSIS — O99892 Other specified diseases and conditions complicating childbirth: Secondary | ICD-10-CM | POA: Diagnosis present

## 2020-11-09 LAB — COMPREHENSIVE METABOLIC PANEL
ALT: 13 U/L (ref 0–44)
AST: 29 U/L (ref 15–41)
Albumin: 2 g/dL — ABNORMAL LOW (ref 3.5–5.0)
Alkaline Phosphatase: 84 U/L (ref 38–126)
Anion gap: 12 (ref 5–15)
BUN: 12 mg/dL (ref 6–20)
CO2: 18 mmol/L — ABNORMAL LOW (ref 22–32)
Calcium: 8.5 mg/dL — ABNORMAL LOW (ref 8.9–10.3)
Chloride: 106 mmol/L (ref 98–111)
Creatinine, Ser: 0.64 mg/dL (ref 0.44–1.00)
GFR, Estimated: >60 mL/min (ref >60)
Glucose, Bld: 149 mg/dL — ABNORMAL HIGH (ref 70–99)
Potassium: 3.8 mmol/L (ref 3.5–5.1)
Sodium: 136 mmol/L (ref 135–145)
Total Bilirubin: 0.4 mg/dL (ref 0.3–1.2)
Total Protein: 5.3 g/dL — ABNORMAL LOW (ref 6.5–8.1)

## 2020-11-09 LAB — LACTIC ACID, PLASMA: Lactic Acid, Venous: 1.1 mmol/L (ref 0.5–1.9)

## 2020-11-09 LAB — CBC WITH DIFFERENTIAL/PLATELET
Abs Immature Granulocytes: 0 10*3/uL (ref 0.00–0.07)
Basophils Absolute: 0 10*3/uL (ref 0.0–0.1)
Basophils Relative: 0 %
Eosinophils Absolute: 0 10*3/uL (ref 0.0–0.5)
Eosinophils Relative: 0 %
HCT: 29.7 % — ABNORMAL LOW (ref 36.0–46.0)
Hemoglobin: 10.7 g/dL — ABNORMAL LOW (ref 12.0–15.0)
Lymphocytes Relative: 2 %
Lymphs Abs: 0.2 10*3/uL — ABNORMAL LOW (ref 0.7–4.0)
MCH: 31.4 pg (ref 26.0–34.0)
MCHC: 36 g/dL (ref 30.0–36.0)
MCV: 87.1 fL (ref 80.0–100.0)
Monocytes Absolute: 0.1 10*3/uL (ref 0.1–1.0)
Monocytes Relative: 1 %
Neutro Abs: 8.5 10*3/uL — ABNORMAL HIGH (ref 1.7–7.7)
Neutrophils Relative %: 97 %
Platelets: 154 10*3/uL (ref 150–400)
RBC: 3.41 MIL/uL — ABNORMAL LOW (ref 3.87–5.11)
RDW: 13.9 % (ref 11.5–15.5)
WBC: 8.8 10*3/uL (ref 4.0–10.5)
nRBC: 0 % (ref 0.0–0.2)
nRBC: 0 /100 WBC

## 2020-11-09 LAB — LIPID PANEL
Cholesterol: 109 mg/dL (ref 0–200)
HDL: 38 mg/dL — ABNORMAL LOW (ref >40)
LDL Cholesterol: 52 mg/dL (ref 0–99)
Total CHOL/HDL Ratio: 2.9 RATIO
Triglycerides: 95 mg/dL (ref <150)
VLDL: 19 mg/dL (ref 0–40)

## 2020-11-09 LAB — MAGNESIUM: Magnesium: 1.8 mg/dL (ref 1.7–2.4)

## 2020-11-09 LAB — GLUCOSE, CAPILLARY
Glucose-Capillary: 120 mg/dL — ABNORMAL HIGH (ref 70–99)
Glucose-Capillary: 140 mg/dL — ABNORMAL HIGH (ref 70–99)
Glucose-Capillary: 143 mg/dL — ABNORMAL HIGH (ref 70–99)
Glucose-Capillary: 175 mg/dL — ABNORMAL HIGH (ref 70–99)
Glucose-Capillary: 183 mg/dL — ABNORMAL HIGH (ref 70–99)
Glucose-Capillary: 198 mg/dL — ABNORMAL HIGH (ref 70–99)
Glucose-Capillary: 96 mg/dL (ref 70–99)

## 2020-11-09 LAB — D-DIMER, QUANTITATIVE: D-Dimer, Quant: 1.01 ug/mL-FEU — ABNORMAL HIGH (ref 0.00–0.50)

## 2020-11-09 LAB — FERRITIN: Ferritin: 127 ng/mL (ref 11–307)

## 2020-11-09 LAB — PHOSPHORUS: Phosphorus: 3.9 mg/dL (ref 2.5–4.6)

## 2020-11-09 LAB — PROCALCITONIN: Procalcitonin: 0.65 ng/mL

## 2020-11-09 LAB — LACTATE DEHYDROGENASE: LDH: 332 U/L — ABNORMAL HIGH (ref 98–192)

## 2020-11-09 LAB — HEMOGLOBIN A1C
Hgb A1c MFr Bld: 5.8 % — ABNORMAL HIGH (ref 4.8–5.6)
Mean Plasma Glucose: 119.76 mg/dL

## 2020-11-09 LAB — C-REACTIVE PROTEIN: CRP: 18.1 mg/dL — ABNORMAL HIGH (ref <1.0)

## 2020-11-09 SURGERY — Surgical Case
Anesthesia: Spinal

## 2020-11-09 MED ORDER — EPINEPHRINE PF 1 MG/ML IJ SOLN
INTRAMUSCULAR | Status: AC
  Filled 2020-11-09: qty 1

## 2020-11-09 MED ORDER — MORPHINE SULFATE (PF) 0.5 MG/ML IJ SOLN
INTRAMUSCULAR | Status: DC | PRN
  Administered 2020-11-09: 150 ug via INTRATHECAL

## 2020-11-09 MED ORDER — WITCH HAZEL-GLYCERIN EX PADS: 1.0000 "application " | MEDICATED_PAD | CUTANEOUS | Status: DC | PRN

## 2020-11-09 MED ORDER — INSULIN GLARGINE 100 UNIT/ML ~~LOC~~ SOLN
6.0000 [IU] | Freq: Every day | SUBCUTANEOUS | Status: DC
  Administered 2020-11-09 – 2020-11-10 (×2): 6 [IU] via SUBCUTANEOUS
  Filled 2020-11-09 (×3): qty 0.06

## 2020-11-09 MED ORDER — CEFAZOLIN SODIUM-DEXTROSE 2-4 GM/100ML-% IV SOLN
INTRAVENOUS | Status: AC
  Filled 2020-11-09: qty 100

## 2020-11-09 MED ORDER — COCONUT OIL OIL: 1.0000 "application " | TOPICAL_OIL | Status: DC | PRN

## 2020-11-09 MED ORDER — SCOPOLAMINE 1 MG/3DAYS TD PT72
1.0000 | MEDICATED_PATCH | Freq: Once | TRANSDERMAL | Status: DC
  Administered 2020-11-09: 1.5 mg via TRANSDERMAL

## 2020-11-09 MED ORDER — OXYTOCIN-SODIUM CHLORIDE 30-0.9 UT/500ML-% IV SOLN: 2.5000 [IU]/h | INTRAVENOUS | Status: AC

## 2020-11-09 MED ORDER — ONDANSETRON HCL 4 MG/2ML IJ SOLN: 4.0000 mg | Freq: Once | INTRAMUSCULAR | Status: DC | PRN

## 2020-11-09 MED ORDER — ONDANSETRON HCL 4 MG/2ML IJ SOLN
4.0000 mg | Freq: Three times a day (TID) | INTRAMUSCULAR | Status: DC | PRN
Start: 2020-11-09 — End: 2020-11-11

## 2020-11-09 MED ORDER — FENTANYL CITRATE (PF) 100 MCG/2ML IJ SOLN
INTRAMUSCULAR | Status: AC
  Filled 2020-11-09: qty 2

## 2020-11-09 MED ORDER — ZOLPIDEM TARTRATE 5 MG PO TABS: 5.0000 mg | ORAL_TABLET | Freq: Every evening | ORAL | Status: DC | PRN

## 2020-11-09 MED ORDER — PHENYLEPHRINE HCL-NACL 20-0.9 MG/250ML-% IV SOLN
INTRAVENOUS | Status: DC | PRN
  Administered 2020-11-09: 60 ug/min via INTRAVENOUS

## 2020-11-09 MED ORDER — SENNOSIDES-DOCUSATE SODIUM 8.6-50 MG PO TABS
2.0000 | ORAL_TABLET | Freq: Every day | ORAL | Status: DC
  Administered 2020-11-10 – 2020-11-11 (×2): 2 via ORAL
  Filled 2020-11-09 (×2): qty 2

## 2020-11-09 MED ORDER — PHENYLEPHRINE HCL-NACL 20-0.9 MG/250ML-% IV SOLN
INTRAVENOUS | Status: AC
  Filled 2020-11-09: qty 250

## 2020-11-09 MED ORDER — DEXAMETHASONE SODIUM PHOSPHATE 10 MG/ML IJ SOLN
INTRAMUSCULAR | Status: AC
  Filled 2020-11-09: qty 1

## 2020-11-09 MED ORDER — OXYTOCIN-SODIUM CHLORIDE 30-0.9 UT/500ML-% IV SOLN
INTRAVENOUS | Status: AC
  Filled 2020-11-09: qty 500

## 2020-11-09 MED ORDER — NALOXONE HCL 4 MG/10ML IJ SOLN
1.0000 ug/kg/h | INTRAVENOUS | Status: DC | PRN
  Filled 2020-11-09: qty 5

## 2020-11-09 MED ORDER — DIPHENHYDRAMINE HCL 50 MG/ML IJ SOLN: 12.5000 mg | INTRAMUSCULAR | Status: DC | PRN

## 2020-11-09 MED ORDER — MEPERIDINE HCL 25 MG/ML IJ SOLN
6.2500 mg | INTRAMUSCULAR | Status: DC | PRN
Start: 2020-11-09 — End: 2020-11-09

## 2020-11-09 MED ORDER — OXYTOCIN-SODIUM CHLORIDE 30-0.9 UT/500ML-% IV SOLN
INTRAVENOUS | Status: DC | PRN
  Administered 2020-11-09: 300 mL via INTRAVENOUS

## 2020-11-09 MED ORDER — NALOXONE HCL 0.4 MG/ML IJ SOLN: 0.4000 mg | INTRAMUSCULAR | Status: DC | PRN

## 2020-11-09 MED ORDER — TETANUS-DIPHTH-ACELL PERTUSSIS 5-2.5-18.5 LF-MCG/0.5 IM SUSY
0.5000 mL | PREFILLED_SYRINGE | Freq: Once | INTRAMUSCULAR | Status: DC
  Filled 2020-11-09: qty 0.5

## 2020-11-09 MED ORDER — ONDANSETRON HCL 4 MG/2ML IJ SOLN
INTRAMUSCULAR | Status: DC | PRN
  Administered 2020-11-09: 4 mg via INTRAVENOUS

## 2020-11-09 MED ORDER — MORPHINE SULFATE (PF) 0.5 MG/ML IJ SOLN
INTRAMUSCULAR | Status: AC
  Filled 2020-11-09: qty 10

## 2020-11-09 MED ORDER — FENTANYL CITRATE (PF) 100 MCG/2ML IJ SOLN
INTRAMUSCULAR | Status: DC | PRN
  Administered 2020-11-09: 15 ug via INTRATHECAL

## 2020-11-09 MED ORDER — SIMETHICONE 80 MG PO CHEW: 80.0000 mg | CHEWABLE_TABLET | ORAL | Status: DC | PRN

## 2020-11-09 MED ORDER — NALBUPHINE HCL 10 MG/ML IJ SOLN: 5.0000 mg | INTRAMUSCULAR | Status: DC | PRN

## 2020-11-09 MED ORDER — SODIUM CHLORIDE 0.9% FLUSH: 3.0000 mL | INTRAVENOUS | Status: DC | PRN

## 2020-11-09 MED ORDER — NALBUPHINE HCL 10 MG/ML IJ SOLN: 5.0000 mg | Freq: Once | INTRAMUSCULAR | Status: DC | PRN

## 2020-11-09 MED ORDER — MORPHINE SULFATE (PF) 4 MG/ML IV SOLN: 1.0000 mg | INTRAVENOUS | Status: DC | PRN

## 2020-11-09 MED ORDER — SIMETHICONE 80 MG PO CHEW
80.0000 mg | CHEWABLE_TABLET | Freq: Three times a day (TID) | ORAL | Status: DC
  Administered 2020-11-09 – 2020-11-11 (×8): 80 mg via ORAL
  Filled 2020-11-09 (×8): qty 1

## 2020-11-09 MED ORDER — INSULIN GLARGINE 100 UNIT/ML ~~LOC~~ SOLN
6.0000 [IU] | Freq: Every day | SUBCUTANEOUS | Status: DC
  Filled 2020-11-09: qty 0.06

## 2020-11-09 MED ORDER — SCOPOLAMINE 1 MG/3DAYS TD PT72
MEDICATED_PATCH | TRANSDERMAL | Status: AC
  Filled 2020-11-09: qty 1

## 2020-11-09 MED ORDER — ENOXAPARIN SODIUM 60 MG/0.6ML ~~LOC~~ SOLN
60.0000 mg | SUBCUTANEOUS | Status: DC
  Administered 2020-11-09 – 2020-11-10 (×2): 60 mg via SUBCUTANEOUS
  Filled 2020-11-09 (×2): qty 0.6

## 2020-11-09 MED ORDER — ONDANSETRON HCL 4 MG/2ML IJ SOLN
INTRAMUSCULAR | Status: AC
  Filled 2020-11-09: qty 2

## 2020-11-09 MED ORDER — DIPHENHYDRAMINE HCL 25 MG PO CAPS: 25.0000 mg | ORAL_CAPSULE | ORAL | Status: DC | PRN

## 2020-11-09 MED ORDER — HYDROMORPHONE HCL 1 MG/ML IJ SOLN: 0.2500 mg | INTRAMUSCULAR | Status: DC | PRN

## 2020-11-09 MED ORDER — INSULIN GLARGINE 100 UNIT/ML ~~LOC~~ SOLN: 5.0000 [IU] | Freq: Every day | SUBCUTANEOUS | Status: DC

## 2020-11-09 MED ORDER — BUPIVACAINE IN DEXTROSE 0.75-8.25 % IT SOLN
INTRATHECAL | Status: DC | PRN
  Administered 2020-11-09: 1.6 mL via INTRATHECAL

## 2020-11-09 MED ORDER — KETOROLAC TROMETHAMINE 30 MG/ML IJ SOLN
30.0000 mg | Freq: Once | INTRAMUSCULAR | Status: AC | PRN
  Administered 2020-11-09: 30 mg via INTRAVENOUS

## 2020-11-09 MED ORDER — DEXAMETHASONE SODIUM PHOSPHATE 10 MG/ML IJ SOLN
INTRAMUSCULAR | Status: DC | PRN
  Administered 2020-11-09: 10 mg via INTRAVENOUS

## 2020-11-09 MED ORDER — LIDOCAINE HCL (PF) 2 % IJ SOLN
INTRAMUSCULAR | Status: AC
  Filled 2020-11-09: qty 10

## 2020-11-09 MED ORDER — PRENATAL MULTIVITAMIN CH
1.0000 | ORAL_TABLET | Freq: Every day | ORAL | Status: DC
  Administered 2020-11-09 – 2020-11-11 (×3): 1 via ORAL
  Filled 2020-11-09 (×3): qty 1

## 2020-11-09 MED ORDER — NALBUPHINE HCL 10 MG/ML IJ SOLN
5.0000 mg | Freq: Once | INTRAMUSCULAR | Status: DC | PRN
Start: 2020-11-09 — End: 2020-11-11

## 2020-11-09 MED ORDER — KETOROLAC TROMETHAMINE 30 MG/ML IJ SOLN
INTRAMUSCULAR | Status: AC
  Filled 2020-11-09: qty 1

## 2020-11-09 MED ORDER — OXYCODONE HCL 5 MG PO TABS
5.0000 mg | ORAL_TABLET | ORAL | Status: DC | PRN
  Administered 2020-11-10: 10 mg via ORAL
  Administered 2020-11-10 – 2020-11-11 (×2): 5 mg via ORAL
  Filled 2020-11-09 (×2): qty 1
  Filled 2020-11-09: qty 2
  Filled 2020-11-09: qty 1

## 2020-11-09 MED ORDER — DIBUCAINE (PERIANAL) 1 % EX OINT: 1.0000 "application " | TOPICAL_OINTMENT | CUTANEOUS | Status: DC | PRN

## 2020-11-09 MED ORDER — MENTHOL 3 MG MT LOZG: 1.0000 | LOZENGE | OROMUCOSAL | Status: DC | PRN

## 2020-11-09 MED ORDER — NALBUPHINE HCL 10 MG/ML IJ SOLN
5.0000 mg | INTRAMUSCULAR | Status: DC | PRN
Start: 2020-11-09 — End: 2020-11-11

## 2020-11-09 MED ORDER — DIPHENHYDRAMINE HCL 25 MG PO CAPS: 25.0000 mg | ORAL_CAPSULE | Freq: Four times a day (QID) | ORAL | Status: DC | PRN

## 2020-11-09 SURGICAL SUPPLY — 38 items
BENZOIN TINCTURE PRP APPL 2/3 (GAUZE/BANDAGES/DRESSINGS) ×2 IMPLANT
CHLORAPREP W/TINT 26ML (MISCELLANEOUS) ×2 IMPLANT
CLAMP CORD UMBIL (MISCELLANEOUS) IMPLANT
CLOSURE STERI STRIP 1/2 X4 (GAUZE/BANDAGES/DRESSINGS) ×2 IMPLANT
CLOTH BEACON ORANGE TIMEOUT ST (SAFETY) ×2 IMPLANT
DRAPE C SECTION CLR SCREEN (DRAPES) ×2 IMPLANT
DRSG OPSITE POSTOP 4X10 (GAUZE/BANDAGES/DRESSINGS) ×2 IMPLANT
ELECT REM PT RETURN 9FT ADLT (ELECTROSURGICAL) ×2
ELECTRODE REM PT RTRN 9FT ADLT (ELECTROSURGICAL) ×1 IMPLANT
EXTRACTOR VACUUM KIWI (MISCELLANEOUS) IMPLANT
GLOVE BIOGEL M 7.0 STRL (GLOVE) ×2 IMPLANT
GLOVE BIOGEL PI IND STRL 6.5 (GLOVE) ×1 IMPLANT
GLOVE BIOGEL PI IND STRL 7.0 (GLOVE) ×2 IMPLANT
GLOVE BIOGEL PI INDICATOR 6.5 (GLOVE) ×1
GLOVE BIOGEL PI INDICATOR 7.0 (GLOVE) ×2
GOWN STRL REUS W/TWL LRG LVL3 (GOWN DISPOSABLE) ×6 IMPLANT
HEMOSTAT ARISTA ABSORB 3G PWDR (HEMOSTASIS) ×2 IMPLANT
HOVERMATT SINGLE USE (MISCELLANEOUS) ×2 IMPLANT
KIT ABG SYR 3ML LUER SLIP (SYRINGE) IMPLANT
NEEDLE HYPO 25X5/8 SAFETYGLIDE (NEEDLE) IMPLANT
NS IRRIG 1000ML POUR BTL (IV SOLUTION) ×2 IMPLANT
PACK C SECTION WH (CUSTOM PROCEDURE TRAY) ×2 IMPLANT
PAD OB MATERNITY 4.3X12.25 (PERSONAL CARE ITEMS) ×2 IMPLANT
PENCIL SMOKE EVAC W/HOLSTER (ELECTROSURGICAL) ×2 IMPLANT
RTRCTR C-SECT PINK 25CM LRG (MISCELLANEOUS) IMPLANT
STRIP CLOSURE SKIN 1/2X4 (GAUZE/BANDAGES/DRESSINGS) ×4 IMPLANT
SUT MNCRL 0 VIOLET CTX 36 (SUTURE) ×2 IMPLANT
SUT MON AB 2-0 CT1 27 (SUTURE) ×2 IMPLANT
SUT MONOCRYL 0 CTX 36 (SUTURE) ×4
SUT PDS AB 0 CTX 36 PDP370T (SUTURE) ×2 IMPLANT
SUT PDS AB 0 CTX 60 (SUTURE) ×4 IMPLANT
SUT PLAIN 0 NONE (SUTURE) IMPLANT
SUT VIC AB 2-0 CT1 27 (SUTURE)
SUT VIC AB 2-0 CT1 TAPERPNT 27 (SUTURE) IMPLANT
SUT VIC AB 4-0 KS 27 (SUTURE) ×4 IMPLANT
TOWEL OR 17X24 6PK STRL BLUE (TOWEL DISPOSABLE) ×2 IMPLANT
TRAY FOLEY W/BAG SLVR 14FR LF (SET/KITS/TRAYS/PACK) ×2 IMPLANT
WATER STERILE IRR 1000ML POUR (IV SOLUTION) ×2 IMPLANT

## 2020-11-09 NOTE — Progress Notes (Signed)
PROGRESS NOTE    [name]  YNW:[id number] DOB: 1991/06/02 DOA: 11/07/2020 PCP: Patient, No Pcp Per     Brief Narrative:  [name] is an 29 y.o.WF PMHx pseudotumor cerebri, 36w gestation.  Gestational diabetes, tobacco abuse (discontinued for pregnancy).   Admitted to MAU for contractions, cough, fever.  Pt unvaccinated to COVID-19.  Started feeling ill 2 days ago.  Symptoms are progressively worsening, symptoms are constant.  Nothing makes better or worse.  WBC nl, COVID+, BNP 130, CXR shows cardiomegally and B pulmonary edema but "infection not excluded".  Hospitalist consulted.   Subjective: 12/28 T-max overnight 38.3 C,, patient looks much more comfortable now that she has delivered baby.   Assessment & Plan: Covid vaccination; unvaccinated   Principal Problem:   Sepsis due to COVID-19 [facility]) Active Problems:   Gestational diabetes mellitus (GDM), antepartum   [redacted] weeks gestation of pregnancy   COVID-19 virus infection   Sepsis, unspecified organism ([place])   Gestational diabetes   Anxiety   COVID-19  Sepsis  -On admission patient met criteria for sepsis temp> 30C, HR> 90, site of infection lungs (on PCXR bilateral opacification)   COVID-19 Pneumonia/CAP? [place]    11/07/20 2231 11/08/20 0444 11/08/20 0824 11/09/20 0607  DDIMER  --   --  1.91* 1.01*  FERRITIN  --   --  123 127  LDH  --   --  329* 332*  CRP 18.8* 19.1*  --  18.1*    Lab Results  Component Value Date   SARSCOV2NAA POSITIVE (A) 11/07/2020  -Remdesivir per pharmacy protocol -Solu-Medrol 60 mg BID -Vitamins per Covid protocol -Continuous pulse ox.  Patient's inflammatory markers are climbing and patient beginning to become short of breath on room air.  Latest SPO2 92% and a 36-week gestational women -12/28 spoke with [place] from the [facility] pharmacy discussed using baricitinib given that patient's O2 status is declining.  Her recommendation is that we  not use given the very limited literature on its use in pregnant patients. -Patient may have bacterial superinfection on top of Covid pneumonia will obtain lactic acid and procalcitonin if elevated will start patient on antibiotics. Results for Julie, West (MRN [id number]) as of 11/09/2020 17:18  Ref. Range 11/07/2020 22:31 11/09/2020 06:07  Procalcitonin Latest Units: ng/mL 0.61 0.65  -Lactic acid negative, and negative leukocytosis we will hold on initiating antibiotics especially given that patient has just given birth and will be breast-feeding.  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 -12/27 echocardiogram normal -Strict ins and outs -Daily weight -Lasix IV 40 mg x 1  Anxiety -As needed Ativan     DVT prophylaxis: Subcu heparin Code Status: Full Family Communication: 12/28 mother and newborn son at bedside for discussion of plan of care answered all questions Status is: Inpatient    Dispo: The patient is from: Home              Anticipated d/c is to: Home              Anticipated d/c date is: 1/6              Patient currently unstable      Consultants:  OB/GYN   Procedures/Significant  Events:  12/27 echocardiogram; normal   I have personally reviewed and interpreted all radiology studies and my findings are as above.  VENTILATOR SETTINGS: Nasal cannula 12/28 Flow; 2 L/min SPO2 96%    Cultures     Antimicrobials: Anti-infectives (From admission, onward)   Start     Ordered Stop   11/09/20 0600  ceFAZolin (ANCEF) IVPB 2g/100 mL premix        11/08/20 2251 11/09/20 0232   11/08/20 1000  remdesivir 100 mg in sodium chloride 0.9 % 100 mL IVPB        "Followed by" Linked Group Details   11/07/20 2242 11/12/20 0959   11/07/20 2330  remdesivir 200 mg in sodium chloride 0.9% 250 mL IVPB       "Followed by" Linked Group Details   11/07/20 2242 11/08/20 0036       Devices    LINES / TUBES:      Continuous Infusions: . lactated ringers 75 mL/hr at 11/09/20 0225  . naLOXone [facility]) adult infusion for PRURITIS    . oxytocin    . remdesivir 100 mg in NS 100 mL 100 mg (11/08/20 1107)     Objective: Vitals:   11/09/20 0625 11/09/20 0649 11/09/20 0655 11/09/20 0733  BP:  118/61    Pulse:  67    Resp:  20    Temp:  (!) 97.5 F (36.4 C)  (!) 97.5 F (36.4 C)  TempSrc:  Oral  Oral  SpO2: 90% 90% 96%   Weight:      Height:        Intake/Output Summary (Last 24 hours) at 11/09/2020 3267 Last data filed at 11/09/2020 0600 Gross per 24 hour  Intake 2950 ml  Output 1365 ml  Net 1585 ml   Filed Weights   11/07/20 2326 11/09/20 0623  Weight: 117 kg 117 kg   Physical Exam:  General: A/O x4, positive acute respiratory distress Eyes: negative scleral hemorrhage, negative anisocoria, negative icterus ENT: Negative Runny nose, negative gingival bleeding, Neck:  Negative scars, masses, torticollis, lymphadenopathy, JVD Lungs: Clear to auscultation bilaterally without wheezes or crackles Cardiovascular: Regular rate and rhythm without murmur gallop or rub normal S1 and S2 Abdomen: negative abdominal pain, nondistended, positive soft, bowel sounds, no rebound, no ascites, no appreciable mass Extremities: No significant cyanosis, clubbing, or edema bilateral lower extremities Skin: Negative rashes, lesions, ulcers Psychiatric:  Negative depression, negative anxiety, negative fatigue, negative mania  Central nervous system:  Cranial nerves II through XII intact, tongue/uvula midline, all extremities muscle strength 5/5, sensation intact throughout,negative dysarthria, negative expressive aphasia, negative receptive aphasia.    .     Data Reviewed: Care during the described time interval was provided by me .  I have reviewed this patient's available data, including medical history, events of note, physical examination, and all test results as part of my evaluation.  CBC: Recent Labs  Lab 11/07/20 1813 11/08/20 0444  WBC 6.8 6.2  NEUTROABS 6.1 5.6  HGB 11.7* 10.7*  HCT 33.5* 31.3*  MCV 89.6 88.7  PLT 146* 124*   Basic Metabolic Panel: Recent Labs  Lab 11/07/20 1813 11/08/20 0444 11/08/20 0824 11/09/20 0607  NA 128* 131*  --  136  K 3.5 3.0*  --  3.8  CL 101 104  --  106  CO2 17* 17*  --  18*  GLUCOSE 92 114*  --  149*  BUN 6 8  --  12  CREATININE 0.76 0.77  --  0.64  CALCIUM 7.9* 7.9*  --  8.5*  MG  --   --  1.8 1.8  PHOS  --   --  2.6 3.9   GFR: Estimated Creatinine Clearance: 131.6 mL/min (by C-G formula based on SCr of 0.64 mg/dL). Liver Function Tests: Recent Labs  Lab 11/07/20 1813 11/08/20 0444 11/09/20 0607  AST 33 29 29  ALT 11 11 13   ALKPHOS 96 85 84  BILITOT 0.7 0.4 0.4  PROT 6.0* 5.7* 5.3*  ALBUMIN 2.4* 2.2* 2.0*   No results for input(s): LIPASE, AMYLASE in the last 168 hours. No results for input(s): AMMONIA in the last 168 hours. Coagulation Profile: No results for input(s): INR, PROTIME in the last 168 hours. Cardiac Enzymes: No results for input(s): CKTOTAL, CKMB, CKMBINDEX, TROPONINI in the last 168 hours. BNP (last 3 results) No results for input(s): PROBNP in the last 8760 hours. HbA1C: Recent Labs    11/08/20 0444  HGBA1C 5.9*   CBG: Recent Labs  Lab 11/08/20 1327 11/08/20 1713 11/08/20 2002 11/09/20 0024 11/09/20 0439  GLUCAP 92 94 185* 140* 143*   Lipid Profile: Recent Labs    11/09/20 0607  CHOL 109  HDL 38*  LDLCALC 52  TRIG 95  CHOLHDL 2.9   Thyroid Function Tests: No results for input(s): TSH, T4TOTAL, FREET4, T3FREE, THYROIDAB in the last 72 hours. Anemia Panel: Recent Labs    11/08/20 0824 11/09/20 0607  FERRITIN 123  127   Sepsis Labs: Recent Labs  Lab 11/07/20 2231  PROCALCITON 0.61    Recent Results (from the past 240 hour(s))  Resp Panel by RT-PCR (Flu A&B, Covid) Nasopharyngeal Swab     Status: Abnormal   Collection Time: 11/07/20  6:13 PM   Specimen: Nasopharyngeal Swab; Nasopharyngeal(NP) swabs in vial transport medium  Result Value Ref Range Status   SARS Coronavirus 2 by RT PCR POSITIVE (A) NEGATIVE Final    Comment: RESULT CALLED TO, READ BACK BY AND VERIFIED WITH: RN Jimmy Footman [id number] MLM (NOTE) SARS-CoV-2 target nucleic acids are DETECTED.  The SARS-CoV-2 RNA is generally detectable in upper respiratory specimens during the acute phase of infection. Positive results are indicative of the presence of the identified virus, but do not rule out bacterial infection or co-infection with other pathogens not detected by the test. Clinical correlation with patient history and other diagnostic information is necessary to determine patient infection status. The expected result is Negative.  Fact Sheet for Patients: EntrepreneurPulse.com.au  Fact Sheet for Healthcare Providers: IncredibleEmployment.be  This test is not yet approved or cleared by the [place] FDA and  has been authorized for detection and/or diagnosis of SARS-CoV-2 by FDA under an Emergency Use Authorization (EUA).  This EUA will remain in effect (meaning this test can be used)  for the duration of  the COVID-19 declaration under Section 564(b)(1) of the Act, 21 U.S.C. section 360bbb-3(b)(1), unless the authorization is terminated or revoked sooner.     Influenza A by PCR NEGATIVE NEGATIVE Final   Influenza B by PCR NEGATIVE NEGATIVE Final    Comment: (NOTE) The Xpert Xpress SARS-CoV-2/FLU/RSV plus assay is intended as an aid in the diagnosis of influenza from Nasopharyngeal swab specimens and should not be used as a sole basis for treatment. Nasal washings and aspirates  are unacceptable for Xpert Xpress SARS-CoV-2/FLU/RSV testing.  Fact Sheet for Patients: EntrepreneurPulse.com.au  Fact Sheet for Healthcare Providers: IncredibleEmployment.be  This test is not yet approved or cleared by the [place] FDA and has been  authorized for detection and/or diagnosis of SARS-CoV-2 by FDA under an Emergency Use Authorization (EUA). This EUA will remain in effect (meaning this test can be used) for the duration of the COVID-19 declaration under Section 564(b)(1) of the Act, 21 U.S.C. section 360bbb-3(b)(1), unless the authorization is terminated or revoked.  Performed at [facility], [place] [address] [place], [place] [address]          Radiology Studies: DG Chest 1 View  Result Date: 11/07/2020 CLINICAL DATA:  29 year old female with shortness of breath. EXAM: CHEST  1 VIEW COMPARISON:  Chest radiograph dated 06/28/2008 FINDINGS: There is cardiomegaly with vascular congestion and edema. Pneumonia is not excluded clinical correlation is recommended. There is no pleural effusion pneumothorax. No acute osseous pathology. IMPRESSION: Cardiomegaly with findings of CHF. Pneumonia is not excluded. Electronically Signed   By: Anner Crete M.D.   On: 11/07/2020 19:04   [place] MFM FETAL BPP WO NON STRESS  Result Date: 11/08/2020 ----------------------------------------------------------------------  OBSTETRICS REPORT                        (Signed Final 11/08/2020 08:44 am) ---------------------------------------------------------------------- Patient Info  ID #:       [id number]                          D.O.B.:  11-18-1990 (29 yrs)  Name:       [name]                 Visit Date: 11/08/2020 01:56 am ---------------------------------------------------------------------- Performed By  Attending:        Sander Nephew      Ref. Address:      [id number]                                                              [place]                                                              [place]  Performed By:     Dorena Dew     Secondary Phy.:    [facility]                    BS, RDMS  Care  Referred By:      Pleas Koch             [facility]                    [place] ---------------------------------------------------------------------- Orders  #  Description                           Code        Ordered By  1  [place] MFM FETAL BPP WO NON               76819.01    NANCY PROTHERO     STRESS ----------------------------------------------------------------------  #  Order #                     Accession #                Episode #  1  [id number]                   [id number]                 [id number] ---------------------------------------------------------------------- Indications  Medical complication of pregnancy (Covid +      O26.90  11-07-20)  [redacted] weeks gestation of pregnancy                 Z3A.36  Preterm contractions                            O47.00  Fetal heart rate decelerations affecting        O36.8390  management of mother ---------------------------------------------------------------------- Fetal Evaluation  Num Of Fetuses:          1  Fetal Heart Rate(bpm):   171  Cardiac Activity:        Observed  Presentation:            Cephalic  Amniotic Fluid  AFI FV:      Within normal limits  AFI Sum(cm)     %Tile       Largest Pocket(cm)  16.9            63          5.2  RUQ(cm)       RLQ(cm)       LUQ(cm)        LLQ(cm)  3.9           3.5           4.3            5.2 ---------------------------------------------------------------------- Biophysical Evaluation  Amniotic F.V:   Within normal limits       F. Tone:         Observed  F. Movement:    Observed                   Score:           8/8  F.  Breathing:   Observed ---------------------------------------------------------------------- OB History  Gravidity:    1         Term:   0  Prem:   0        SAB:   0  TOP:          0       Ectopic:  0        Living: 0 ---------------------------------------------------------------------- Gestational Age  Clinical EDD:  36w 3d                                        EDD:   12/03/20  Best:          36w 3d     Det. By:  Clinical EDD             EDD:   12/03/20 ---------------------------------------------------------------------- Impression  Antenatal testing due to variable decelerations seen on a  reactive tracing  Biophysical profile 8/8 with good fetal movement and  amniotic fluid volume. ---------------------------------------------------------------------- Recommendations  Follow up as clinically indicated. ----------------------------------------------------------------------               Sander Nephew, MD Electronically Signed Final Report   11/08/2020 08:44 am ----------------------------------------------------------------------  ECHOCARDIOGRAM COMPLETE  Result Date: 11/08/2020    ECHOCARDIOGRAM REPORT   Patient Name:   TIASHA HELVIE Date of Exam: 11/08/2020 Medical Rec #:  [id number]       Height:       64.0 in Accession #:    [id number]      Weight:       257.9 lb Date of Birth:  05/27/1991       BSA:          2.180 m Patient Age:    29 years        BP:           116/69 mmHg Patient Gender: F               HR:           79 bpm. Exam Location:  Inpatient Procedure: 2D Echo, Cardiac Doppler and Color Doppler Indications:    I42.9 Cardiomyopathy (unspecified)  History:        Patient has no prior history of Echocardiogram examinations.                 Signs/Symptoms:Dyspnea and Shortness of Breath; Risk                 Factors:Diabetes. Covid positive. [redacted] weeks pregnant.  Sonographer:    Roseanna Rainbow RDCS Referring Phys: [phone] JARED M GARDNER  Sonographer Comments: Technically difficult study due  to poor echo windows and patient is morbidly obese. Image acquisition challenging due to patient body habitus. IMPRESSIONS  1. Left ventricular ejection fraction, by estimation, is 65 to 70%. Left ventricular ejection fraction by 2D MOD biplane is 65.7 %. The left ventricle has hyperdynamic function. The left ventricle has no regional wall motion abnormalities. There is mild  concentric left ventricular hypertrophy. Left ventricular diastolic parameters were normal.  2. Right ventricular systolic function is normal. The right ventricular size is normal.  3. The mitral valve is grossly normal. No evidence of mitral valve regurgitation.  4. The aortic valve is grossly normal. Aortic valve regurgitation is not visualized. No aortic stenosis is present.  5. The inferior vena cava is normal in size with greater than 50% respiratory variability, suggesting right atrial pressure of 3 mmHg. Comparison(s): No prior Echocardiogram. FINDINGS  Left Ventricle: Left ventricular ejection fraction, by estimation, is  65 to 70%. Left ventricular ejection fraction by 2D MOD biplane is 65.7 %. The left ventricle has hyperdynamic function. The left ventricle has no regional wall motion abnormalities. The left ventricular internal cavity size was normal in size. There is mild concentric left ventricular hypertrophy. Left ventricular diastolic parameters were normal. Right Ventricle: The right ventricular size is normal. No increase in right ventricular wall thickness. Right ventricular systolic function is normal. Left Atrium: Left atrial size was normal in size. Right Atrium: Right atrial size was normal in size. Pericardium: Trivial pericardial effusion is present. Mitral Valve: The mitral valve is grossly normal. No evidence of mitral valve regurgitation. Tricuspid Valve: The tricuspid valve is grossly normal. Tricuspid valve regurgitation is not demonstrated. Aortic Valve: The aortic valve is grossly normal. Aortic valve  regurgitation is not visualized. No aortic stenosis is present. Pulmonic Valve: The pulmonic valve was not well visualized. Pulmonic valve regurgitation is not visualized. Aorta: The aortic root and ascending aorta are structurally normal, with no evidence of dilitation. Venous: The pulmonary veins were not well visualized. The inferior vena cava is normal in size with greater than 50% respiratory variability, suggesting right atrial pressure of 3 mmHg. IAS/Shunts: The atrial septum is grossly normal.  LEFT VENTRICLE PLAX 2D                        Biplane EF (MOD) LVIDd:         4.70 cm         LV Biplane EF:   Left LVIDs:         3.10 cm                          ventricular LV PW:         1.20 cm                          ejection LV IVS:        1.00 cm                          fraction by LVOT diam:     1.90 cm                          2D MOD LV SV:         81                               biplane is LV SV Index:   37                               65.7 %. LVOT Area:     2.84 cm                                Diastology                                LV e' medial:    5.66 cm/s LV Volumes (MOD)               LV E/e' medial:  9.2 LV vol d,  MOD    96.4 ml       LV e' lateral:   11.65 cm/s A2C:                           LV E/e' lateral: 4.5 LV vol d, MOD    80.2 ml A4C: LV vol s, MOD    28.5 ml A2C: LV vol s, MOD    30.3 ml A4C: LV SV MOD A2C:   67.9 ml LV SV MOD A4C:   80.2 ml LV SV MOD BP:    60.5 ml RIGHT VENTRICLE             IVC RV S prime:     14.80 cm/s  IVC diam: 1.40 cm TAPSE (M-mode): 2.6 cm LEFT ATRIUM             Index       RIGHT ATRIUM           Index LA diam:        2.50 cm 1.15 cm/m  RA Area:     11.70 cm LA Vol (A2C):   24.1 ml 11.06 ml/m RA Volume:   23.70 ml  10.87 ml/m LA Vol (A4C):   17.5 ml 8.03 ml/m LA Biplane Vol: 20.4 ml 9.36 ml/m  AORTIC VALVE LVOT Vmax:   131.00 cm/s LVOT Vmean:  96.000 cm/s LVOT VTI:    0.286 m  AORTA Ao Root diam: 3.30 cm Ao Asc diam:  3.10 cm MITRAL VALVE MV Area  (PHT): 3.40 cm    SHUNTS MV Decel Time: 223 msec    Systemic VTI:  0.29 m MV E velocity: 52.16 cm/s  Systemic Diam: 1.90 cm MV A velocity: 77.60 cm/s MV E/A ratio:  0.67 Rudean Haskell MD Electronically signed by Rudean Haskell MD Signature Date/Time: 11/08/2020/12:00:13 PM    Final         Scheduled Meds: . vitamin C  500 mg Oral Daily  . docusate sodium  100 mg Oral Daily  . enoxaparin (LOVENOX) injection  60 mg Subcutaneous Q24H  . insulin aspart  0-15 Units Subcutaneous Q4H  . Ipratropium-Albuterol  1 puff Inhalation Q6H  . methylPREDNISolone (SOLU-MEDROL) injection  60 mg Intravenous Q12H  . prenatal multivitamin  1 tablet Oral Q1200  . scopolamine  1 patch Transdermal Once  . [START ON 11/10/2020] senna-docusate  2 tablet Oral Daily  . sertraline  50 mg Oral QHS  . simethicone  80 mg Oral TID PC  . [START ON 11/10/2020] Tdap  0.5 mL Intramuscular Once  . zinc sulfate  220 mg Oral Daily   Continuous Infusions: . lactated ringers 75 mL/hr at 11/09/20 0225  . naLOXone [facility]) adult infusion for PRURITIS    . oxytocin    . remdesivir 100 mg in NS 100 mL 100 mg (11/08/20 1107)     LOS: 0 days    Time spent:40 min    Lashunda Greis, Geraldo Docker, MD [facility] Pager [phone]  If 7PM-7AM, please contact night-coverage [email or web address] Password [facility] 11/09/2020, 8:28 AM

## 2020-11-09 NOTE — Progress Notes (Signed)
Subjective: Postpartum Day 0: Cesarean Delivery Patient reports tolerating PO and + flatus.   Foley catheter is still in place. Pt has been out of bed recently. Currently sating 94 % on room air. Able to complete sentences without becoming SOB. Pain is well controlled from Cesarean section. Pt reports hallucinations that started this afternoon.  Objective: Vital signs in last 24 hours: Temp:  [97.5 F (36.4 C)-100.9 F (38.3 C)] 97.6 F (36.4 C) (12/28 1208) Pulse Rate:  [63-86] 68 (12/28 1208) Resp:  [17-28] 18 (12/28 1208) BP: (96-139)/(61-84) 117/66 (12/28 1208) SpO2:  [90 %-98 %] 94 % (12/28 1332) Weight:  [117 kg] 117 kg (12/28 5329)  Physical Exam:  General: alert, cooperative and no distress  Lungs Clear bilaterally without rales or rhonchi Lochia: appropriate Uterine Fundus: firm Incision: bandage clean dry and intact  DVT Evaluation: No evidence of DVT seen on physical exam.  Recent Labs    11/08/20 0444 11/09/20 0820  HGB 10.7* 10.7*  HCT 31.3* 29.7*    Assessment/Plan:POD #1 s/p cesarean section due to nonreassuring fetal heart rate/ Covid 19 pneumonia/ A1 DM / Obesity   Status post Cesarean section. Doing well postoperatively.  Encouraged out bed to chair and ambulation usage of incentive spirometry.  Decrease IV fluids as pt is tolerating po.    Covid 19 pneumonia Followed by hospitalist. day 3/5 of remdesivir / Solumedrol/ combivent q 6hours Robitusin  Hallucinations- etiology is unclear. Consider this may be a side effect of covid 19 treatment regimen. Will check with hospitalist.    A1DM.Marland Kitchen blood sugars elevated after chick fil a this morning. Recommend sticking to carb modified diet.. continue sliding scale .  Depression - continue zoloft 50 mg daily   Obesity- DVT prophylaxis continue lovenox 60 mg daily   Will continue to monitor closely     Gerald Leitz 11/09/2020, 1:50 PM

## 2020-11-09 NOTE — Lactation Note (Addendum)
This note was copied from a baby's chart. Lactation Consultation Note  Patient Name: [name] [id number] Date: 11/09/2020 Reason for consult: Initial assessment;Primapara;1st time breastfeeding;Late-preterm 34-36.6wks;Maternal endocrine disorder Age:28 hours Gestational Diabetes, diet controlled  LC in to visit with P1 Mom of LPTI at 6 hrs old.  Baby's first CBG 23 at almost 3 hrs old.  Baby had been to breast at 1 1/2 hrs old in PACU.  Mom has semi-flat nipples with compressible areola.  Mom reports + breast changes in early pregnancy.   Baby was given glucose gel at 0745.  Baby continues to be slightly jittery.  Baby sleeping STS on Mom's chest.    LC assisted with breast massage and hand expression.  Unable to express more than a glistening of colostrum on nipple which was dabbed on baby's mouth.  Some cueing noted and baby positioned prone in laid back position.  Baby not opening his mouth to latch.    Initiated a DEBP and assisted Mom to pump on initiation setting with 24 mm flanges a good fit currently.  Unable to express more than a drop from her left breast only.   Talked to Mom about offering baby donor breast milk as supplement due to baby being a LPTI and having low blood sugar.  Mom agreeable and signed consent form.    LC fed baby 5 ml of donor breast milk by slow flow nipple, and Mom assisted in feeding baby another 5 ml.  Baby fed well and tolerated this well.  Baby burped and placed STS on Mom again.    Plan written on dry erase board in Mom's room- 1- Keep baby STS as much as possible 2- Offer breast with cues, with goal of >8 feedings per 24 hrs 3- Offer baby supplement of 5-10 ml EBM+/donor breast milk by paced slow flow bottle.  Volume needs to increase each day per LPTI guidelines which LC provided. 4-Pump both breasts 15 mins on initiation setting  Mom provided with our lactation brochure and [facility] Breastfeeding booklet along with LPTI guidelines.  Mom is very  motivated to do all that is needed for baby and to establish her milk supply. Mom does not have a DEBP at home.  Mom has Medicaid and declined WIC during pregnancy.  Mom is willing to try to get certified with [facility] now as she is aware of importance of a DEBP to support her milk supply.  WIC referral faxed to [facility].  Maternal Data Formula Feeding for Exclusion: No Has patient been taught Hand Expression?: Yes Does the patient have breastfeeding experience prior to this delivery?: No  Feeding Feeding Type: Donor Breast Milk Nipple Type: Slow - flow  LATCH Score Latch: Too sleepy or reluctant, no latch achieved, no sucking elicited.  Audible Swallowing: None  Type of Nipple: Everted at rest and after stimulation (short nipple shafts and small nipples)  Comfort (Breast/Nipple): Soft / non-tender  Hold (Positioning): Full assist, staff holds infant at breast  LATCH Score: 4  Interventions Interventions: Breast feeding basics reviewed;Assisted with latch;Skin to skin;Breast massage;Hand express;Breast compression;Adjust position;Support pillows;Position options;DEBP  Lactation Tools Discussed/Used Tools: Flanges;Pump Flange Size: 24 Breast pump type: Double-Electric Breast Pump WIC Program: No Pump Education: Setup, frequency, and cleaning;Milk Storage Initiated by:: Erby Pian RN IBCLC Date initiated:: 11/09/20   Consult Status Consult Status: Follow-up Date: 11/10/20 Follow-up type: In-patient    Judee Clara 11/09/2020, 9:33 AM

## 2020-11-09 NOTE — Op Note (Addendum)
Pre Op Dx:   1.  Single live IUP at [redacted]w[redacted]d 2.  Non-reassuring fetal heart tracing 3.  COVID-19 Pneumonia Post Op Dx: Same as pre-operative diagnosis  Procedure:  Low Transverse Cesarean Section  Surgeon:  Dr. Steva Ready Assistants:  Rhea Pink, CNM Anesthesia:  Spinal  QBL:  465cc  IVF:  1000cc UOP:  100cc  Drains:  Foley catheter Specimen removed: Placenta sent to pathology and umbilical cord gases  Device(s) implanted:  None Case Type:  Clean contaminated Findings: Normal-appearing uterus, bilateral fallopian tubes, and ovaries.  Fetus in cephalic position, clear amniotic fluid. Complications: None Indications:  G1P0 admitted for COVID-19 pneumonia with non-reassuring fetal heart tracing (recurrent spontaneous decelerations lasting 2-4 minutes). Procedure:  After informed consent was obtained, the patient was brought to the operating room.  Following administration of spinal anesthesia, the patient was positioned in dorsal supine position with a leftward tilt and was prepped and draped in sterile fashion.  A preoperative time-out was performed.  The abdomen was entered in layers through a pfannenstiel incision and an Teacher, early years/pre was placed.  A low transverse hysterotomy was created sharply to the level of the membranes, then extended bluntly.  The fetus was delivered from cephalic presentation onto the field.  Bulb suctioning was performed.  The cord was doubly clamped and cut after a 30 second pause.  The newborn was passed to the warmer.  The placenta was delivered.  The uterus was swept free of clots and debris and closed in a running locked fashion with 0-Monocryl. A second imbricating layer was used to close the uterus using 0-Monocryl. Arista placed along the hysterotomy.  Hemostasis was verified.  The abdomen was irrigated with warmed saline and cleared of clots.  The peritoneum was closed in a running fashion with 2-0 Monocryl.  Subfascial spaces were inspected and  hemostasis assured.  The fascia was closed in a running fashion with 0-PDS.  The subcutaneous tissues were irrigated and hemostasis assured.  The subcutaneous tissues were closed with 3-0 Monocryl.  The skin was closed with 4-0 Vicryl.  A sterile bandage was applied.  The patient was transferred to PACU.  All needle, sponge, and instrument counts were correct at the end of the case.   Disposition: PACU  I was present and scrubbed and the assistant was required due to complexity of the anatomy.  Steva Ready, DO  .

## 2020-11-09 NOTE — Transfer of Care (Signed)
Immediate Anesthesia Transfer of Care Note  Patient: [name]  Procedure(s) Performed: CESAREAN SECTION (N/A )  Patient Location: PACU  Anesthesia Type:Spinal  Level of Consciousness: awake, alert  and oriented  Airway & Oxygen Therapy: Patient Spontanous Breathing  Post-op Assessment: Report given to RN and Post -op Vital signs reviewed and stable  Post vital signs: Reviewed and stable  Last Vitals:  Vitals Value Taken Time  BP 131/76 11/09/20 0402  Temp    Pulse 70 11/09/20 0406  Resp 20 11/09/20 0406  SpO2 92 % 11/09/20 0406  Vitals shown include unvalidated device data.  Last Pain:  Vitals:   11/08/20 2312  TempSrc: Oral  PainSc:       Patients Stated Pain Goal: 2 (11/08/20 0835)  Complications: No complications documented.

## 2020-11-09 NOTE — Anesthesia Procedure Notes (Addendum)
Spinal  Patient location during procedure: OR Start time: 11/09/2020 12:36 AM End time: 11/09/2020 12:37 AM Staffing Performed: anesthesiologist  Anesthesiologist: Atilano Median, DO Preanesthetic Checklist Completed: patient identified, IV checked, site marked, risks and benefits discussed, surgical consent, monitors and equipment checked, pre-op evaluation and timeout performed Spinal Block Patient position: sitting Prep: DuraPrep Patient monitoring: heart rate, cardiac monitor, continuous pulse ox and blood pressure Approach: midline Location: L3-4 Injection technique: single-shot Needle Needle type: Pencan  Needle gauge: 24 G Needle length: 10 cm Additional Notes Patient identified. Risks/Benefits/Options discussed with patient including but not limited to bleeding, infection, nerve damage, paralysis, failed block, incomplete pain control, headache, blood pressure changes, nausea, vomiting, reactions to medications, itching and postpartum back pain. Confirmed with bedside nurse the patient's most recent platelet count. Confirmed with patient that they are not currently taking any anticoagulation, have any bleeding history or any family history of bleeding disorders. Patient expressed understanding and wished to proceed. All questions were answered. Sterile technique was used throughout the entire procedure. Please see nursing notes for vital signs. Warning signs of high block given to the patient including shortness of breath, tingling/numbness in hands, complete motor block, or any concerning symptoms with instructions to call for help. Patient was given instructions on fall risk and not to get out of bed. All questions and concerns addressed with instructions to call with any issues or inadequate analgesia.

## 2020-11-09 NOTE — Progress Notes (Signed)
[name] was referred for history of depression/anxiety. * Referral screened out by Clinical Social Worker because none of the following criteria appear to apply: ~ History of anxiety/depression during this pregnancy, or of post-partum depression following prior delivery. ~ Diagnosis of anxiety and/or depression within last 3 years OR * [name]'s symptoms currently being treated with medication and/or therapy. [name] has an active Rx for Zoloft.   Please contact the Clinical Social Worker if needs arise, by [name] request, or if [name] scores greater than 9/yes to question 10 on Edinburgh Postpartum Depression Screen.  Almarie Kurdziel Boyd-Gilyard, MSW, LCSW Clinical Social Work [phone]  

## 2020-11-10 ENCOUNTER — Inpatient Hospital Stay (HOSPITAL_COMMUNITY): Payer: Medicaid Other

## 2020-11-10 DIAGNOSIS — J9621 Acute and chronic respiratory failure with hypoxia: Secondary | ICD-10-CM

## 2020-11-10 LAB — PROCALCITONIN: Procalcitonin: 0.29 ng/mL

## 2020-11-10 LAB — GLUCOSE, CAPILLARY
Glucose-Capillary: 109 mg/dL — ABNORMAL HIGH (ref 70–99)
Glucose-Capillary: 112 mg/dL — ABNORMAL HIGH (ref 70–99)
Glucose-Capillary: 156 mg/dL — ABNORMAL HIGH (ref 70–99)
Glucose-Capillary: 143 mg/dL — ABNORMAL HIGH (ref 70–99)
Glucose-Capillary: 93 mg/dL (ref 70–99)

## 2020-11-10 LAB — COMPREHENSIVE METABOLIC PANEL
ALT: 10 U/L (ref 0–44)
AST: 22 U/L (ref 15–41)
Albumin: 1.9 g/dL — ABNORMAL LOW (ref 3.5–5.0)
Alkaline Phosphatase: 65 U/L (ref 38–126)
Anion gap: 8 (ref 5–15)
BUN: 11 mg/dL (ref 6–20)
CO2: 21 mmol/L — ABNORMAL LOW (ref 22–32)
Calcium: 8.2 mg/dL — ABNORMAL LOW (ref 8.9–10.3)
Chloride: 106 mmol/L (ref 98–111)
Creatinine, Ser: 0.48 mg/dL (ref 0.44–1.00)
GFR, Estimated: >60 mL/min (ref >60)
Glucose, Bld: 116 mg/dL — ABNORMAL HIGH (ref 70–99)
Potassium: 4.3 mmol/L (ref 3.5–5.1)
Sodium: 135 mmol/L (ref 135–145)
Total Bilirubin: 0.2 mg/dL — ABNORMAL LOW (ref 0.3–1.2)
Total Protein: 5 g/dL — ABNORMAL LOW (ref 6.5–8.1)

## 2020-11-10 LAB — CBC WITH DIFFERENTIAL/PLATELET
Abs Immature Granulocytes: 0.12 10*3/uL — ABNORMAL HIGH (ref 0.00–0.07)
Basophils Absolute: 0 10*3/uL (ref 0.0–0.1)
Basophils Relative: 0 %
Eosinophils Absolute: 0 10*3/uL (ref 0.0–0.5)
Eosinophils Relative: 0 %
HCT: 26.5 % — ABNORMAL LOW (ref 36.0–46.0)
Hemoglobin: 9 g/dL — ABNORMAL LOW (ref 12.0–15.0)
Immature Granulocytes: 1 %
Lymphocytes Relative: 8 %
Lymphs Abs: 0.7 10*3/uL (ref 0.7–4.0)
MCH: 30.6 pg (ref 26.0–34.0)
MCHC: 34 g/dL (ref 30.0–36.0)
MCV: 90.1 fL (ref 80.0–100.0)
Monocytes Absolute: 0.2 10*3/uL (ref 0.1–1.0)
Monocytes Relative: 3 %
Neutro Abs: 7.5 10*3/uL (ref 1.7–7.7)
Neutrophils Relative %: 88 %
Platelets: 159 10*3/uL (ref 150–400)
RBC: 2.94 MIL/uL — ABNORMAL LOW (ref 3.87–5.11)
RDW: 13.9 % (ref 11.5–15.5)
WBC: 8.5 10*3/uL (ref 4.0–10.5)
nRBC: 0.2 % (ref 0.0–0.2)

## 2020-11-10 LAB — C-REACTIVE PROTEIN: CRP: 6.6 mg/dL — ABNORMAL HIGH (ref <1.0)

## 2020-11-10 LAB — SURGICAL PATHOLOGY

## 2020-11-10 LAB — D-DIMER, QUANTITATIVE: D-Dimer, Quant: 0.49 ug/mL-FEU (ref 0.00–0.50)

## 2020-11-10 LAB — RPR: RPR Ser Ql: NONREACTIVE

## 2020-11-10 LAB — MAGNESIUM: Magnesium: 1.8 mg/dL (ref 1.7–2.4)

## 2020-11-10 LAB — FERRITIN: Ferritin: 104 ng/mL (ref 11–307)

## 2020-11-10 LAB — VITAMIN D 25 HYDROXY (VIT D DEFICIENCY, FRACTURES): Vit D, 25-Hydroxy: 25.8 ng/mL — ABNORMAL LOW (ref 30–100)

## 2020-11-10 LAB — LACTATE DEHYDROGENASE: LDH: 260 U/L — ABNORMAL HIGH (ref 98–192)

## 2020-11-10 LAB — PHOSPHORUS: Phosphorus: 4.3 mg/dL (ref 2.5–4.6)

## 2020-11-10 NOTE — Progress Notes (Signed)
Subjective: Postpartum Day 1: Cesarean Delivery Patient reports incisional pain, tolerating PO, + flatus and no problems voiding.   Pt denies sob.Marland Kitchen upon entry pt resting comfortably.. Nasal cannula on but not applied. Pt sating 89 % on room air. Pt advised to use nasal cannula. She reports using incentive spirometry and sitting up to chair yesterday.   Objective: Vital signs in last 24 hours: Temp:  [97.4 F (36.3 C)-98.7 F (37.1 C)] 97.4 F (36.3 C) (12/29 0451) Pulse Rate:  [56-68] 56 (12/29 0451) Resp:  [18-20] 20 (12/29 0451) BP: (106-119)/(58-78) 118/78 (12/29 0451) SpO2:  [89 %-99 %] 95 % (12/29 0451)  Physical Exam:  General: alert, cooperative and no distress  Lungs Clear to auscultation.Marland Kitchen decreased breathe sounds at bases bilaterally  Lochia: appropriate Uterine Fundus: firm Incision: honeycomb in place clean dry and intact  DVT Evaluation: No evidence of DVT seen on physical exam.  Recent Labs    11/09/20 0820 11/10/20 0740  HGB 10.7* 9.0*  HCT 29.7* 26.5*    Assessment/Plan:POD #1 s/p cesarean section due to nonreassuring fetal heart rate/ Covid 19 pneumonia/ A1 DM / Obesity   Status post Cesarean section. Doing well postoperatively.  Encouraged out bed to chair and ambulation usage of incentive spirometry.    Covid 19 pneumonia Followed by hospitalist. day 4/5 of remdesivir / Solumedrol/ combivent q 6hours Robitusin  Hallucinations- etiology is unclear. Consider this may be a side effect of covid 19 treatment regimen.  Per hospitalist DR. Joseph Art this may be a side effect of solumedrol   A1DM.Marland Kitchen blood sugars elevated after chick fil a this morning. Recommend sticking to carb modified diet.. continue sliding scale .  Depression - continue zoloft 50 mg daily   Obesity- DVT prophylaxis continue lovenox 60 mg daily   Will continue to monitor closely  Dr. Connye Burkitt covering after 5pm this afternoon   Gerald Leitz 11/10/2020, 8:24 AM

## 2020-11-10 NOTE — Anesthesia Postprocedure Evaluation (Signed)
Anesthesia Post Note  Patient: [name]  Procedure(s) Performed: CESAREAN SECTION (N/A )     Patient location during evaluation: Mother Baby Anesthesia Type: Spinal Level of consciousness: oriented and awake and alert Pain management: pain level controlled Vital Signs Assessment: post-procedure vital signs reviewed and stable Respiratory status: spontaneous breathing, respiratory function stable and patient connected to nasal cannula oxygen Cardiovascular status: blood pressure returned to baseline and stable Postop Assessment: no headache, no backache and no apparent nausea or vomiting Anesthetic complications: no   No complications documented.  Last Vitals:  Vitals:   11/10/20 0315 11/10/20 0451  BP:  118/78  Pulse:  (!) 56  Resp:  20  Temp:  (!) 36.3 C  SpO2: 93% 95%    Last Pain:  Vitals:   11/10/20 0617  TempSrc:   PainSc: 8                  [name] [name]

## 2020-11-10 NOTE — Progress Notes (Signed)
PROGRESS NOTE                                                                                                                                                                                                             Patient Demographics:    [name], is a 29 y.o. female, DOB - May 16, 1991, CHE:[id number]  Outpatient Primary MD for the patient is Patient, No Pcp Per   Admit date - 11/07/2020   LOS - 1  Chief Complaint  Patient presents with  . Contractions  . Rupture of Membranes       Brief Narrative: Patient is a 29 y.o. female who was [redacted] weeks pregnant-admitted by OB service for COVID-19 pneumonia-subsequently underwent cesarean section on 12/28 for nonreassuring fetal heart tracing.  Hospitalist service consulted for management of COVID-19 pneumonia.   COVID-19 vaccinated status: Unvaccinated  Significant Events: 12/26>> Admit to [facility] for hypoxia due to COVID-19 pneumonia  Significant studies: 12/26>> chest x-ray: Multifocal pneumonia (personally reviewed) 12/27>> Echo: EF 65-70% 12/29>> chest x-ray: Patchy bilateral airspace disease  COVID-19 medications: Steroids: 12/27>> Remdesivir: 12/26>>  Antibiotics: None  Microbiology data: None  Procedures: None  Consults: None  DVT prophylaxis: SCD's Start: 11/09/20 0516 SCDs Start: 11/07/20 2223 Prophylactic Lovenox     Subjective:    Althia Forts today feels better-she was on 2 L of oxygen when I saw her earlier this morning-while in the room-I placed on room air-O2 saturations was stable around 93-94%.  She was completely awake and alert-no evidence of confusion/hallucinations this morning.   Assessment  & Plan :   Acute Hypoxic Resp Failure due to Covid 19 Viral pneumonia: Improving-very minimal O2 requirements-have emphasized importance of mobility/incentive spirometry.  Continue steroids and Remdesivir.  In the unlikely event of  worsening hypoxemia-she has consented to the use of Actemra.  She has no history of TB/hepatitis B or diverticulitis.  Fever: afebrile O2 requirements:  SpO2: 96 % O2 Flow Rate (L/min): 2 L/min   COVID-19 Labs: Recent Labs    11/08/20 0444 11/08/20 0824 11/09/20 0607 11/10/20 0740  DDIMER  --  1.91* 1.01* 0.49  FERRITIN  --  123 127 104  LDH  --  329* 332* 260*  CRP 19.1*  --  18.1* 6.6*       Component Value Date/Time   BNP 130.9 (H) 11/07/2020 1813  Recent Labs  Lab 11/07/20 2231 11/09/20 0607 11/10/20 0740  PROCALCITON 0.61 0.65 0.29    Lab Results  Component Value Date   SARSCOV2NAA POSITIVE (A) 11/07/2020     Prone/Incentive Spirometry: encouraged  incentive spirometry use 3-4/hour.  Other issues defer to primary service.   Morbid Obesity: Estimated body mass index is 44.27 kg/m as calculated from the following:   Height as of this encounter: 5\' 4"  (1.626 m).   Weight as of this encounter: 117 kg.     ABG: No results found for: PHART, PCO2ART, PO2ART, HCO3, TCO2, ACIDBASEDEF, O2SAT  Vent Settings: N/A   Condition - Stable  Family Communication  : Mother sleeping at bedside-patient will update family herself.  Code Status :  Full Code  Diet :  Diet Order            Diet Carb Modified Fluid consistency: Thin; Room service appropriate? Yes  Diet effective now                  Disposition Plan  :   Per primary service   Antimicorbials  :    Anti-infectives (From admission, onward)   Start     Dose/Rate Route Frequency Ordered Stop   11/09/20 0600  ceFAZolin (ANCEF) IVPB 2g/100 mL premix        2 g 200 mL/hr over 30 Minutes Intravenous On call to O.R. 11/08/20 2251 11/09/20 0232   11/08/20 1000  remdesivir 100 mg in sodium chloride 0.9 % 100 mL IVPB       "Followed by" Linked Group Details   100 mg 200 mL/hr over 30 Minutes Intravenous Daily 11/07/20 2242 11/12/20 0959   11/07/20 2330  remdesivir 200 mg in sodium chloride 0.9%  250 mL IVPB       "Followed by" Linked Group Details   200 mg 580 mL/hr over 30 Minutes Intravenous Once 11/07/20 2242 11/08/20 0036      Inpatient Medications  Scheduled Meds: . vitamin C  500 mg Oral Daily  . docusate sodium  100 mg Oral Daily  . enoxaparin (LOVENOX) injection  60 mg Subcutaneous Q24H  . insulin aspart  0-15 Units Subcutaneous Q4H  . insulin glargine  6 Units Subcutaneous QHS  . Ipratropium-Albuterol  1 puff Inhalation Q6H  . methylPREDNISolone (SOLU-MEDROL) injection  60 mg Intravenous Q12H  . prenatal multivitamin  1 tablet Oral Q1200  . scopolamine  1 patch Transdermal Once  . senna-docusate  2 tablet Oral Daily  . sertraline  50 mg Oral QHS  . simethicone  80 mg Oral TID PC  . Tdap  0.5 mL Intramuscular Once  . zinc sulfate  220 mg Oral Daily   Continuous Infusions: . lactated ringers 75 mL/hr at 11/09/20 0225  . naLOXone [facility]) adult infusion for PRURITIS    . remdesivir 100 mg in NS 100 mL Stopped (11/09/20 1044)   PRN Meds:.acetaminophen, calcium carbonate, chlorpheniramine-HYDROcodone, coconut oil, witch hazel-glycerin **AND** dibucaine, diphenhydrAMINE, guaiFENesin-dextromethorphan, LORazepam, menthol-cetylpyridinium, morphine injection, nalbuphine **OR** nalbuphine, nalbuphine **OR** nalbuphine, naloxone **AND** sodium chloride flush, naLOXone (NARCAN) adult infusion for PRURITIS, ondansetron (ZOFRAN) IV, oxyCODONE, simethicone, zolpidem   Time Spent in minutes  25  See all Orders from today for further details   Jeoffrey Massed M.D on 11/10/2020 at 10:57 AM  To page go to [email or web address] - use universal password  [facility] -  Office  [phone]    Objective:   Vitals:   11/10/20 8295 11/10/20 0840 11/10/20 0845 11/10/20 6213  BP:      Pulse:      Resp:      Temp:      TempSrc:      SpO2: 96% 97% 96% 96%  Weight:      Height:        Wt Readings from Last 3 Encounters:  11/09/20 117 kg  06/15/17 104.3 kg  06/10/17  104.3 kg     Intake/Output Summary (Last 24 hours) at 11/10/2020 1057 Last data filed at 11/10/2020 0845 Gross per 24 hour  Intake --  Output 2025 ml  Net -2025 ml     Physical Exam Gen Exam:Alert awake-not in any distress HEENT:atraumatic, normocephalic Chest: B/L clear to auscultation anteriorly CVS:S1S2 regular Abdomen:soft non tender, non distended Extremities:no edema Neurology: Non focal Skin: no rash   Data Review:    CBC Recent Labs  Lab 11/07/20 1813 11/08/20 0444 11/09/20 0820 11/10/20 0740  WBC 6.8 6.2 8.8 8.5  HGB 11.7* 10.7* 10.7* 9.0*  HCT 33.5* 31.3* 29.7* 26.5*  PLT 146* 125* 154 159  MCV 89.6 88.7 87.1 90.1  MCH 31.3 30.3 31.4 30.6  MCHC 34.9 34.2 36.0 34.0  RDW 13.9 14.0 13.9 13.9  LYMPHSABS 0.5* 0.5* 0.2* 0.7  MONOABS 0.1 0.1 0.1 0.2  EOSABS 0.0 0.0 0.0 0.0  BASOSABS 0.0 0.0 0.0 0.0    Chemistries  Recent Labs  Lab 11/07/20 1813 11/08/20 0444 11/08/20 0824 11/09/20 0607 11/10/20 0740  NA 128* 131*  --  136 135  K 3.5 3.0*  --  3.8 4.3  CL 101 104  --  106 106  CO2 17* 17*  --  18* 21*  GLUCOSE 92 114*  --  149* 116*  BUN 6 8  --  12 11  CREATININE 0.76 0.77  --  0.64 0.48  CALCIUM 7.9* 7.9*  --  8.5* 8.2*  MG  --   --  1.8 1.8 1.8  AST 33 29  --  29 22  ALT 11 11  --  13 10  ALKPHOS 96 85  --  84 65  BILITOT 0.7 0.4  --  0.4 0.2*   ------------------------------------------------------------------------------------------------------------------ Recent Labs    11/09/20 0607  CHOL 109  HDL 38*  LDLCALC 52  TRIG 95  CHOLHDL 2.9    Lab Results  Component Value Date   HGBA1C 5.8 (H) 11/09/2020   ------------------------------------------------------------------------------------------------------------------ No results for input(s): TSH, T4TOTAL, T3FREE, THYROIDAB in the last 72 hours.  Invalid input(s):  FREET3 ------------------------------------------------------------------------------------------------------------------ Recent Labs    11/09/20 0607 11/10/20 0740  FERRITIN 127 104    Coagulation profile No results for input(s): INR, PROTIME in the last 168 hours.  Recent Labs    11/09/20 0607 11/10/20 0740  DDIMER 1.01* 0.49    Cardiac Enzymes No results for input(s): CKMB, TROPONINI, MYOGLOBIN in the last 168 hours.  Invalid input(s): CK ------------------------------------------------------------------------------------------------------------------    Component Value Date/Time   BNP 130.9 (H) 11/07/2020 1813    Micro Results Recent Results (from the past 240 hour(s))  Resp Panel by RT-PCR (Flu A&B, Covid) Nasopharyngeal Swab     Status: Abnormal   Collection Time: 11/07/20  6:13 PM   Specimen: Nasopharyngeal Swab; Nasopharyngeal(NP) swabs in vial transport medium  Result Value Ref Range Status   SARS Coronavirus 2 by RT PCR POSITIVE (A) NEGATIVE Final    Comment: RESULT CALLED TO, READ BACK BY AND VERIFIED WITH: RN Clancy Gourd [phone] MLM (NOTE) SARS-CoV-2 target nucleic acids are DETECTED.  The  SARS-CoV-2 RNA is generally detectable in upper respiratory specimens during the acute phase of infection. Positive results are indicative of the presence of the identified virus, but do not rule out bacterial infection or co-infection with other pathogens not detected by the test. Clinical correlation with patient history and other diagnostic information is necessary to determine patient infection status. The expected result is Negative.  Fact Sheet for Patients: BloggerCourse.com  Fact Sheet for Healthcare Providers: SeriousBroker.it  This test is not yet approved or cleared by the [place] FDA and  has been authorized for detection and/or diagnosis of SARS-CoV-2 by FDA under an Emergency Use Authorization  (EUA).  This EUA will remain in effect (meaning this test can be used)  for the duration of  the COVID-19 declaration under Section 564(b)(1) of the Act, 21 U.S.C. section 360bbb-3(b)(1), unless the authorization is terminated or revoked sooner.     Influenza A by PCR NEGATIVE NEGATIVE Final   Influenza B by PCR NEGATIVE NEGATIVE Final    Comment: (NOTE) The Xpert Xpress SARS-CoV-2/FLU/RSV plus assay is intended as an aid in the diagnosis of influenza from Nasopharyngeal swab specimens and should not be used as a sole basis for treatment. Nasal washings and aspirates are unacceptable for Xpert Xpress SARS-CoV-2/FLU/RSV testing.  Fact Sheet for Patients: BloggerCourse.com  Fact Sheet for Healthcare Providers: SeriousBroker.it  This test is not yet approved or cleared by the [place] FDA and has been authorized for detection and/or diagnosis of SARS-CoV-2 by FDA under an Emergency Use Authorization (EUA). This EUA will remain in effect (meaning this test can be used) for the duration of the COVID-19 declaration under Section 564(b)(1) of the Act, 21 U.S.C. section 360bbb-3(b)(1), unless the authorization is terminated or revoked.  Performed at [facility], [address], [place], [place] [address]     Radiology Reports DG Chest 1 View  Result Date: 11/07/2020 CLINICAL DATA:  29 year old female with shortness of breath. EXAM: CHEST  1 VIEW COMPARISON:  Chest radiograph dated 06/28/2008 FINDINGS: There is cardiomegaly with vascular congestion and edema. Pneumonia is not excluded clinical correlation is recommended. There is no pleural effusion pneumothorax. No acute osseous pathology. IMPRESSION: Cardiomegaly with findings of CHF. Pneumonia is not excluded. Electronically Signed   By: Elgie Collard M.D.   On: 11/07/2020 19:04   DG Chest Port 1V same Day  Result Date: 11/10/2020 CLINICAL DATA:  COVID, shortness of  breath, low O2 sats EXAM: PORTABLE CHEST 1 VIEW COMPARISON:  11/07/2020 FINDINGS: Patchy bilateral airspace disease again noted, slightly improved since prior study. Heart is normal size. No effusions or pneumothorax. No acute bony abnormality. IMPRESSION: Patchy bilateral airspace disease, slightly improved since prior study. Electronically Signed   By: Charlett Nose M.D.   On: 11/10/2020 08:15   [place] MFM FETAL BPP WO NON STRESS  Result Date: 11/08/2020 ----------------------------------------------------------------------  OBSTETRICS REPORT                        (Signed Final 11/08/2020 08:44 am) ---------------------------------------------------------------------- Patient Info  ID #:       [id number]                          D.O.B.:  1991-05-26 (29 yrs)  Name:       Althia Forts                 Visit Date: 11/08/2020 01:56 am ---------------------------------------------------------------------- Performed By  Attending:        Lin Landsman      Ref. Address:      [address]                    [place]                                                              [id number]                                                              [place]                                                              [address]  Performed By:     Earley Brooke     Secondary Phy.:    [facility]                    BS, RDMS                                                              Care  Referred By:      Henderson Newcomer             [facility] ---------------------------------------------------------------------- Orders  #  Description                           Code        Ordered By  1  [place] MFM FETAL BPP WO NON               76819.01    NANCY PROTHERO     STRESS ----------------------------------------------------------------------  #  Order #                     Accession #                Episode #  1  [id number]                    [id number]                 [id number] ---------------------------------------------------------------------- Indications  Medical complication of pregnancy (Covid +      O26.90  11-07-20)  [redacted] weeks gestation of pregnancy                 Z3A.36  Preterm contractions                            O47.00  Fetal heart rate decelerations affecting        O36.8390  management of mother ---------------------------------------------------------------------- Fetal Evaluation  Num Of Fetuses:          1  Fetal Heart Rate(bpm):   171  Cardiac Activity:        Observed  Presentation:            Cephalic  Amniotic Fluid  AFI FV:      Within normal limits  AFI Sum(cm)     %Tile       Largest Pocket(cm)  16.9            63          5.2  RUQ(cm)       RLQ(cm)       LUQ(cm)        LLQ(cm)  3.9           3.5           4.3            5.2 ---------------------------------------------------------------------- Biophysical Evaluation  Amniotic F.V:   Within normal limits       F. Tone:         Observed  F. Movement:    Observed                   Score:           8/8  F. Breathing:   Observed ---------------------------------------------------------------------- OB History  Gravidity:    1         Term:   0        Prem:   0        SAB:   0  TOP:          0       Ectopic:  0        Living: 0 ---------------------------------------------------------------------- Gestational Age  Clinical EDD:  36w 3d                                        EDD:   12/03/20  Best:          36w 3d     Det. By:  Clinical EDD             EDD:   12/03/20 ---------------------------------------------------------------------- Impression  Antenatal testing due to variable decelerations seen on a  reactive tracing  Biophysical profile 8/8 with good fetal movement and  amniotic fluid volume. ---------------------------------------------------------------------- Recommendations  Follow up as clinically indicated.  ----------------------------------------------------------------------               Lin Landsman, MD Electronically Signed Final Report   11/08/2020 08:44 am ----------------------------------------------------------------------  ECHOCARDIOGRAM COMPLETE  Result Date: 11/08/2020    ECHOCARDIOGRAM REPORT   Patient Name:   ELLEN GORIS Date of Exam: 11/08/2020 Medical Rec #:  [id number]       Height:       64.0 in Accession #:    [id number]      Weight:       257.9 lb Date  of Birth:  1991/09/11       BSA:          2.180 m Patient Age:    29 years        BP:           116/69 mmHg Patient Gender: F               HR:           79 bpm. Exam Location:  Inpatient Procedure: 2D Echo, Cardiac Doppler and Color Doppler Indications:    I42.9 Cardiomyopathy (unspecified)  History:        Patient has no prior history of Echocardiogram examinations.                 Signs/Symptoms:Dyspnea and Shortness of Breath; Risk                 Factors:Diabetes. Covid positive. [redacted] weeks pregnant.  Sonographer:    Sheralyn Boatman RDCS Referring Phys: [phone] JARED M GARDNER  Sonographer Comments: Technically difficult study due to poor echo windows and patient is morbidly obese. Image acquisition challenging due to patient body habitus. IMPRESSIONS  1. Left ventricular ejection fraction, by estimation, is 65 to 70%. Left ventricular ejection fraction by 2D MOD biplane is 65.7 %. The left ventricle has hyperdynamic function. The left ventricle has no regional wall motion abnormalities. There is mild  concentric left ventricular hypertrophy. Left ventricular diastolic parameters were normal.  2. Right ventricular systolic function is normal. The right ventricular size is normal.  3. The mitral valve is grossly normal. No evidence of mitral valve regurgitation.  4. The aortic valve is grossly normal. Aortic valve regurgitation is not visualized. No aortic stenosis is present.  5. The inferior vena cava is normal in size with greater than 50%  respiratory variability, suggesting right atrial pressure of 3 mmHg. Comparison(s): No prior Echocardiogram. FINDINGS  Left Ventricle: Left ventricular ejection fraction, by estimation, is 65 to 70%. Left ventricular ejection fraction by 2D MOD biplane is 65.7 %. The left ventricle has hyperdynamic function. The left ventricle has no regional wall motion abnormalities. The left ventricular internal cavity size was normal in size. There is mild concentric left ventricular hypertrophy. Left ventricular diastolic parameters were normal. Right Ventricle: The right ventricular size is normal. No increase in right ventricular wall thickness. Right ventricular systolic function is normal. Left Atrium: Left atrial size was normal in size. Right Atrium: Right atrial size was normal in size. Pericardium: Trivial pericardial effusion is present. Mitral Valve: The mitral valve is grossly normal. No evidence of mitral valve regurgitation. Tricuspid Valve: The tricuspid valve is grossly normal. Tricuspid valve regurgitation is not demonstrated. Aortic Valve: The aortic valve is grossly normal. Aortic valve regurgitation is not visualized. No aortic stenosis is present. Pulmonic Valve: The pulmonic valve was not well visualized. Pulmonic valve regurgitation is not visualized. Aorta: The aortic root and ascending aorta are structurally normal, with no evidence of dilitation. Venous: The pulmonary veins were not well visualized. The inferior vena cava is normal in size with greater than 50% respiratory variability, suggesting right atrial pressure of 3 mmHg. IAS/Shunts: The atrial septum is grossly normal.  LEFT VENTRICLE PLAX 2D                        Biplane EF (MOD) LVIDd:         4.70 cm         LV Biplane EF:  Left LVIDs:         3.10 cm                          ventricular LV PW:         1.20 cm                          ejection LV IVS:        1.00 cm                          fraction by LVOT diam:     1.90 cm                           2D MOD LV SV:         81                               biplane is LV SV Index:   37                               65.7 %. LVOT Area:     2.84 cm                                Diastology                                LV e' medial:    5.66 cm/s LV Volumes (MOD)               LV E/e' medial:  9.2 LV vol d, MOD    96.4 ml       LV e' lateral:   11.65 cm/s A2C:                           LV E/e' lateral: 4.5 LV vol d, MOD    80.2 ml A4C: LV vol s, MOD    28.5 ml A2C: LV vol s, MOD    30.3 ml A4C: LV SV MOD A2C:   67.9 ml LV SV MOD A4C:   80.2 ml LV SV MOD BP:    60.5 ml RIGHT VENTRICLE             IVC RV S prime:     14.80 cm/s  IVC diam: 1.40 cm TAPSE (M-mode): 2.6 cm LEFT ATRIUM             Index       RIGHT ATRIUM           Index LA diam:        2.50 cm 1.15 cm/m  RA Area:     11.70 cm LA Vol (A2C):   24.1 ml 11.06 ml/m RA Volume:   23.70 ml  10.87 ml/m LA Vol (A4C):   17.5 ml 8.03 ml/m LA Biplane Vol: 20.4 ml 9.36 ml/m  AORTIC VALVE LVOT Vmax:   131.00 cm/s LVOT Vmean:  96.000 cm/s LVOT VTI:    0.286 m  AORTA Ao Root diam: 3.30 cm Ao Asc diam:  3.10 cm MITRAL VALVE MV  Area (PHT): 3.40 cm    SHUNTS MV Decel Time: 223 msec    Systemic VTI:  0.29 m MV E velocity: 52.16 cm/s  Systemic Diam: 1.90 cm MV A velocity: 77.60 cm/s MV E/A ratio:  0.67 Riley Lam MD Electronically signed by Riley Lam MD Signature Date/Time: 11/08/2020/12:00:13 PM    Final

## 2020-11-11 LAB — CBC WITH DIFFERENTIAL/PLATELET
Abs Immature Granulocytes: 0 10*3/uL (ref 0.00–0.07)
Basophils Absolute: 0 10*3/uL (ref 0.0–0.1)
Basophils Relative: 0 %
Eosinophils Absolute: 0 10*3/uL (ref 0.0–0.5)
Eosinophils Relative: 0 %
HCT: 29.8 % — ABNORMAL LOW (ref 36.0–46.0)
Hemoglobin: 10 g/dL — ABNORMAL LOW (ref 12.0–15.0)
Lymphocytes Relative: 7 %
Lymphs Abs: 0.6 10*3/uL — ABNORMAL LOW (ref 0.7–4.0)
MCH: 30.5 pg (ref 26.0–34.0)
MCHC: 33.6 g/dL (ref 30.0–36.0)
MCV: 90.9 fL (ref 80.0–100.0)
Monocytes Absolute: 0.3 10*3/uL (ref 0.1–1.0)
Monocytes Relative: 3 %
Neutro Abs: 7.9 10*3/uL — ABNORMAL HIGH (ref 1.7–7.7)
Neutrophils Relative %: 90 %
Platelets: 200 10*3/uL (ref 150–400)
RBC: 3.28 MIL/uL — ABNORMAL LOW (ref 3.87–5.11)
RDW: 14 % (ref 11.5–15.5)
WBC: 8.8 10*3/uL (ref 4.0–10.5)
nRBC: 1 /100 WBC — ABNORMAL HIGH
nRBC: 1.4 % — ABNORMAL HIGH (ref 0.0–0.2)

## 2020-11-11 LAB — GLUCOSE, CAPILLARY
Glucose-Capillary: 176 mg/dL — ABNORMAL HIGH (ref 70–99)
Glucose-Capillary: 70 mg/dL (ref 70–99)
Glucose-Capillary: 83 mg/dL (ref 70–99)

## 2020-11-11 LAB — COMPREHENSIVE METABOLIC PANEL
ALT: 18 U/L (ref 0–44)
AST: 33 U/L (ref 15–41)
Albumin: 2.3 g/dL — ABNORMAL LOW (ref 3.5–5.0)
Alkaline Phosphatase: 66 U/L (ref 38–126)
Anion gap: 12 (ref 5–15)
BUN: 17 mg/dL (ref 6–20)
CO2: 21 mmol/L — ABNORMAL LOW (ref 22–32)
Calcium: 8.6 mg/dL — ABNORMAL LOW (ref 8.9–10.3)
Chloride: 105 mmol/L (ref 98–111)
Creatinine, Ser: 0.71 mg/dL (ref 0.44–1.00)
GFR, Estimated: >60 mL/min (ref >60)
Glucose, Bld: 76 mg/dL (ref 70–99)
Potassium: 4 mmol/L (ref 3.5–5.1)
Sodium: 138 mmol/L (ref 135–145)
Total Bilirubin: 0.4 mg/dL (ref 0.3–1.2)
Total Protein: 5.6 g/dL — ABNORMAL LOW (ref 6.5–8.1)

## 2020-11-11 LAB — D-DIMER, QUANTITATIVE: D-Dimer, Quant: 0.76 ug/mL-FEU — ABNORMAL HIGH (ref 0.00–0.50)

## 2020-11-11 LAB — C-REACTIVE PROTEIN: CRP: 3.7 mg/dL — ABNORMAL HIGH (ref <1.0)

## 2020-11-11 MED ORDER — ACETAMINOPHEN 325 MG PO TABS: 650.0000 mg | ORAL_TABLET | Freq: Four times a day (QID) | ORAL | 0 refills | Status: DC | PRN

## 2020-11-11 MED ORDER — PREDNISONE 10 MG PO TABS: 10.0000 mg | ORAL_TABLET | ORAL | 0 refills | Status: DC

## 2020-11-11 NOTE — Discharge Instructions (Signed)
10 Things You Can Do to Manage Your COVID-19 Symptoms at Home If you have possible or confirmed COVID-19: 1. Stay home from work and school. And stay away from other public places. If you must go out, avoid using any kind of public transportation, ridesharing, or taxis. 2. Monitor your symptoms carefully. If your symptoms get worse, call your healthcare provider immediately. 3. Get rest and stay hydrated. 4. If you have a medical appointment, call the healthcare provider ahead of time and tell them that you have or may have COVID-19. 5. For medical emergencies, call 911 and notify the dispatch personnel that you have or may have COVID-19. 6. Cover your cough and sneezes with a tissue or use the inside of your elbow. 7. Wash your hands often with soap and water for at least 20 seconds or clean your hands with an alcohol-based hand sanitizer that contains at least 60% alcohol. 8. As much as possible, stay in a specific room and away from other people in your home. Also, you should use a separate bathroom, if available. If you need to be around other people in or outside of the home, wear a mask. 9. Avoid sharing personal items with other people in your household, like dishes, towels, and bedding. 10. Clean all surfaces that are touched often, like counters, tabletops, and doorknobs. Use household cleaning sprays or wipes according to the label instructions. cdc.gov/coronavirus 05/14/2019 This information is not intended to replace advice given to you by your health care provider. Make sure you discuss any questions you have with your health care provider. Document Revised: 10/16/2019 Document Reviewed: 10/16/2019 Elsevier Patient Education  2020 [employer]  

## 2020-11-11 NOTE — Discharge Summary (Signed)
Postpartum Discharge Summary  11/11/20    Patient Name: [name] DOB: 1991/03/21 MRN: [id number]  Date of admission: 11/07/2020 Delivery date:11/09/2020  Delivering provider: Drema Dallas  Date of discharge: 11/11/2020  Admitting diagnosis: COVID-19 [U07.1] Intrauterine pregnancy: [redacted]w[redacted]d    Secondary diagnosis:  Principal Problem:   Sepsis due to COVID-19 ([facility] Active Problems:   Gestational diabetes mellitus (GDM), antepartum   [redacted] weeks gestation of pregnancy   COVID-19 virus infection   Sepsis, unspecified organism ([facility]   Gestational diabetes   Anxiety   COVID-19  Additional problems: None   Discharge diagnosis: Preterm Pregnancy Delivered                                              Post partum procedures:None Augmentation: N/A Complications: None  Hospital course: Unplanned C/S   29y.o. yo G1P0101 at 362w4das admitted to the hospital 11/07/2020 for COVID-19 pneumonia.  She subsequently underwent cesarean section with the following indication:Non-Reassuring FHR.Delivery details are as follows:  Membrane Rupture Time/Date: 3:05 AM ,11/09/2020   Delivery Method:C-Section, Low Transverse  Details of operation can be found in separate operative note.  Patient had an uncomplicated postpartum course.  She is ambulating, tolerating a regular diet, passing flatus, and urinating well. Patient is discharged home in stable condition on  11/12/20      She was initially admitted on 12/26 for rule out labor but she had symptoms of cough and fever. COVID test found to be positive.  Upon admission, fetal heart tracing was Category II with variable decelerations.  She had a chest x-ray which revealed cardiomegaly with vascular congestion and edema. She was admitted for observation and a dose of Lasix 2063mas administered. Echocardiogram was normal with EF 65-70%  Hospitalist were consulted and she was placed on Remdesivir and Solumedrol for treatment.  Her O2 saturations got as low  as 89% with ambulation but remained stable on 1-2L O2 at 91-97% for most of her admission.  BPP was 8/8, however, ultimately FHT continued to show spontaneous variable decelerations and 2-4 minute decelerations down to 60s.  MFM was previously consulted to follow along in this patient's care.  After review of fetal heart tracing with MFM, it was confirmed that there was an indication for delivery for non-reassuring fetal heart tracing despite 8/8 BPP.  She underwent cesarean delivery as documented above.  Her post-operative course was unremarkable.  She was discharged home with a Prednisone steroid taper.    Newborn Data: Birth date:11/09/2020  Birth time:3:05 AM  Gender:Female  Living status:Living  Apgars:9 ,9  Weight:3031 g     Magnesium Sulfate received: No BMZ received: No Rhophylac:No MMR:No T-DaP:Given prenatally Flu: Unknown Transfusion:No  Physical exam  Vitals:   11/11/20 0400 11/11/20 0415 11/11/20 0928 11/11/20 1257  BP:  97/83 123/77 119/72  Pulse:  67 71   Resp:  16 20 20   Temp:  (!) 97.2 F (36.2 C) 97.6 F (36.4 C) (!) 97.5 F (36.4 C)  TempSrc:  Oral Oral Oral  SpO2: 97% 95% 95% 93%  Weight:      Height:       General: alert, cooperative and no distress Lochia: appropriate Uterine Fundus: firm Incision: Dressing is clean, dry, and intact DVT Evaluation: No evidence of DVT seen on physical exam. No cords or calf tenderness. Labs: Lab Results  Component Value Date  WBC 8.8 11/11/2020   HGB 10.0 (L) 11/11/2020   HCT 29.8 (L) 11/11/2020   MCV 90.9 11/11/2020   PLT 200 11/11/2020   CMP Latest Ref Rng & Units 11/11/2020  Glucose 70 - 99 mg/dL 76  BUN 6 - 20 mg/dL 17  Creatinine 0.44 - 1.00 mg/dL 0.71  Sodium 135 - 145 mmol/L 138  Potassium 3.5 - 5.1 mmol/L 4.0  Chloride 98 - 111 mmol/L 105  CO2 22 - 32 mmol/L 21(L)  Calcium 8.9 - 10.3 mg/dL 8.6(L)  Total Protein 6.5 - 8.1 g/dL 5.6(L)  Total Bilirubin 0.3 - 1.2 mg/dL 0.4  Alkaline Phos 38 - 126  U/L 66  AST 15 - 41 U/L 33  ALT 0 - 44 U/L 18   Edinburgh Score: Edinburgh Postnatal Depression Scale Screening Tool 11/10/2020  I have been able to laugh and see the funny side of things. 0  I have looked forward with enjoyment to things. 0  I have blamed myself unnecessarily when things went wrong. 0  I have been anxious or worried for no good reason. 0  I have felt scared or panicky for no good reason. 0  Things have been getting on top of me. 1  I have been so unhappy that I have had difficulty sleeping. 0  I have felt sad or miserable. 0  I have been so unhappy that I have been crying. 0  The thought of harming myself has occurred to me. 0  Edinburgh Postnatal Depression Scale Total 1      After visit meds:  Allergies as of 11/11/2020      Reactions   Amoxicillin Rash   Bactrim [sulfamethoxazole-trimethoprim] Rash      Medication List    STOP taking these medications   aspirin EC 81 MG tablet   calcium carbonate 500 MG chewable tablet Commonly known as: TUMS - dosed in mg elemental calcium   docusate sodium 100 MG capsule Commonly known as: Colace   methocarbamol 500 MG tablet Commonly known as: Robaxin   ondansetron 4 MG tablet Commonly known as: Zofran   oxyCODONE-acetaminophen 5-325 MG tablet Commonly known as: Roxicet   prenatal multivitamin Tabs tablet   sertraline 50 MG tablet Commonly known as: ZOLOFT     TAKE these medications   acetaminophen 325 MG tablet Commonly known as: Tylenol Take 2 tablets (650 mg total) by mouth every 6 (six) hours as needed for mild pain or moderate pain. What changed:   medication strength  how much to take  when to take this  reasons to take this   predniSONE 10 MG tablet Commonly known as: DELTASONE Take 1 tablet (10 mg total) by mouth as directed. Day 1 - 46m Day 2 - 449mDay 3 - 3075may 4 - 70m5my 5 - 10mg39m    Discharge home in stable condition Infant Feeding: Breast Infant  Disposition:home with mother Discharge instruction: per After Visit Summary and Postpartum booklet. Activity: Advance as tolerated. Pelvic rest for 6 weeks.  Diet: routine diet Anticipated Birth Control: Unsure Postpartum Appointment:6 weeks Additional Postpartum F/U: None Future Appointments:No future appointments. Follow up Visit:  Follow-up Information    Cole,Christophe LouisFollow up in 2 week(s).   Specialty: Obstetrics and Gynecology Why: 2 week wound check and 6 week postpartum visit Contact information: [phone]  11/11/2020 Drema Dallas, DO

## 2020-11-11 NOTE — Progress Notes (Signed)
Postpartum Note Day # 2  S:  Patient doing well.  Pain controlled.  Tolerating regular diet.   Ambulating and voiding without difficulty.   Denies fevers, chills, chest pain, SOB, N/V, or worsening bilateral LE edema.  Lochia: Minimal Infant feeding:  Breast Circumcision:  Desires at some point Contraception:  None  O: Temp:  [97.2 F (36.2 C)-97.5 F (36.4 C)] 97.2 F (36.2 C) (12/30 0415) Pulse Rate:  [49-67] 67 (12/30 0415) Resp:  [16-20] 16 (12/30 0415) BP: (97-123)/(62-83) 97/83 (12/30 0415) SpO2:  [87 %-97 %] 95 % (12/30 0415) Gen: NAD, pleasant and cooperative CV: RRR Resp: CTAB, no wheezes/rales/rhonchi Abdomen: soft, non-distended, non-tender throughout Uterus: firm, non-tender, below umbilicus Incision: c/d/i, bandage in place, no surrounding erythema Ext: trace bilateral LE edema, no bilateral calf tenderness, SCDs on and working  Labs: Recent Labs    11/09/20 0820 11/10/20 0740  HGB 10.7* 9.0*    A/P: Patient is a 29 y.o. G1P0101 POD#1 s/p primary LTCS for non-reassuring fetal heart tracing.  S/p LTCS - Pain well controlled  - GU: UOP is adequate - GI: Tolerating regular diet - Activity: encouraged sitting up to chair and ambulation as tolerated - DVT Prophylaxis: SCDs, prophylactic Lovenox 60mg  qHS - Labs: stable as above  COVID-19 PNA - Followed by Hospitalist and appreciate recommendations - O2 satruations 91-97% on 1L [place] - No currently wearing O2 without increased work of breathing - Currently on Remdesivir, Solumedrol, Combivent and Robitusin  Class A1DM - FSBS due to solumedrol - BS range 70-176 - Continue SSI  Depression - Medications:  Zoloft 50mg  qD - Will need a 2 week EPDS  Obesity  - SCDs  - Lovenox 60mg  qD   Disposition:  Pending maternal status and hospitalist recommendations.  , DO [phone] (office)

## 2020-11-11 NOTE — Progress Notes (Signed)
Discharge instructions given and reviewed with patient. Discussed follow up appointment, medications including prednisone, and pain management, postpartum care. Patient verbalized understanding.

## 2020-11-11 NOTE — Progress Notes (Signed)
PROGRESS NOTE                                                                                                                                                                                                             Patient Demographics:    [name], is a 29 y.o. female, DOB - [birth year], ZOX:[id number]  Outpatient Primary MD for the patient is Patient, No Pcp Per   Admit date - 11/07/2020   LOS - 2  Chief Complaint  Patient presents with  . Contractions  . Rupture of Membranes       Brief Narrative: Patient is a 29 y.o. female who was [redacted] weeks pregnant-admitted by OB service for COVID-19 pneumonia-subsequently underwent cesarean section on 12/28 for nonreassuring fetal heart tracing.  Hospitalist service consulted for management of COVID-19 pneumonia.   COVID-19 vaccinated status: Unvaccinated  Significant Events: 12/26>> Admit to [facility] for hypoxia due to COVID-19 pneumonia  Significant studies: 12/26>> chest x-ray: Multifocal pneumonia (personally reviewed) 12/27>> Echo: EF 65-70% 12/29>> chest x-ray: Patchy bilateral airspace disease  COVID-19 medications: Steroids: 12/27>> Remdesivir: 12/26>>  Antibiotics: None  Microbiology data: None  Procedures: None  Consults: None  DVT prophylaxis: SCD's Start: 11/09/20 0516 SCDs Start: 11/07/20 2223 Prophylactic Lovenox     Subjective:   Feels much better-no shortness of breath when she ambulates.  Her O2 saturation was in the mid 90s on room air this morning when I was in the room.   Assessment  & Plan :   Acute Hypoxic Resp Failure due to Covid 19 Viral pneumonia: Significantly better-she was titrated to room air early this morning-while I was in the room-O2 saturation was in the mid 90s.  She failed symptomatically better as well-without any shortness of breath when she ambulates.  She will complete her fifth dose of Remdesivir  today-following which she can be discharged home on tapering steroids-epic message sent to [facility] MD.  She was also counseled regarding importance of completing her COVID-19 vaccination series.  I will sign off-please reconsult as needed.  Fever: afebrile O2 requirements:  SpO2: 95 % O2 Flow Rate (L/min): 1 L/min   COVID-19 Labs: Recent Labs    11/09/20 0607 11/10/20 0740 11/11/20 0702  DDIMER 1.01* 0.49 0.76*  FERRITIN 127 104  --   LDH 332* 260*  --   CRP 18.1* 6.6*  3.7*       Component Value Date/Time   BNP 130.9 (H) 11/07/2020 1813    Recent Labs  Lab 11/07/20 2231 11/09/20 0607 11/10/20 0740  PROCALCITON 0.61 0.65 0.29    Lab Results  Component Value Date   SARSCOV2NAA POSITIVE (A) 11/07/2020     Prone/Incentive Spirometry: encouraged  incentive spirometry use 3-4/hour.  Other issues defer to primary service.   Morbid Obesity: Estimated body mass index is 44.27 kg/m as calculated from the following:   Height as of this encounter:  (1.626 m).   Weight as of this encounter: 117 kg.     ABG: No results found for: PHART, PCO2ART, PO2ART, HCO3, TCO2, ACIDBASEDEF, O2SAT  Vent Settings: N/A   Condition - Stable  Family Communication  : Mother at bedside  Code Status :  Full Code  Diet :  Diet Order            Diet Carb Modified Fluid consistency: Thin; Room service appropriate? Yes  Diet effective now                  Disposition Plan  :   Per primary service   Antimicorbials  :    Anti-infectives (From admission, onward)   Start     Dose/Rate Route Frequency Ordered Stop   11/09/20 0600  ceFAZolin (ANCEF) IVPB 2g/100 mL premix        2 g 200 mL/hr over 30 Minutes Intravenous On call to O.R. 11/08/20 2251 11/09/20 0232   11/08/20 1000  remdesivir 100 mg in sodium chloride 0.9 % 100 mL IVPB       "Followed by" Linked Group Details   100 mg 200 mL/hr over 30 Minutes Intravenous Daily 11/07/20 2242 11/11/20 0947   11/07/20 2330   remdesivir 200 mg in sodium chloride 0.9% 250 mL IVPB       "Followed by" Linked Group Details   200 mg 580 mL/hr over 30 Minutes Intravenous Once 11/07/20 2242 11/08/20 0036      Inpatient Medications  Scheduled Meds: . vitamin C  500 mg Oral Daily  . docusate sodium  100 mg Oral Daily  . enoxaparin (LOVENOX) injection  60 mg Subcutaneous Q24H  . insulin aspart  0-15 Units Subcutaneous Q4H  . insulin glargine  6 Units Subcutaneous QHS  . Ipratropium-Albuterol  1 puff Inhalation Q6H  . methylPREDNISolone (SOLU-MEDROL) injection  60 mg Intravenous Q12H  . prenatal multivitamin  1 tablet Oral Q1200  . scopolamine  1 patch Transdermal Once  . senna-docusate  2 tablet Oral Daily  . sertraline  50 mg Oral QHS  . simethicone  80 mg Oral TID PC  . Tdap  0.5 mL Intramuscular Once  . zinc sulfate  220 mg Oral Daily   Continuous Infusions: . lactated ringers 75 mL/hr at 11/09/20 0225  . naLOXone [facility]) adult infusion for PRURITIS     PRN Meds:.acetaminophen, calcium carbonate, chlorpheniramine-HYDROcodone, coconut oil, witch hazel-glycerin **AND** dibucaine, diphenhydrAMINE, guaiFENesin-dextromethorphan, LORazepam, menthol-cetylpyridinium, morphine injection, nalbuphine **OR** nalbuphine, nalbuphine **OR** nalbuphine, naloxone **AND** sodium chloride flush, naLOXone (NARCAN) adult infusion for PRURITIS, ondansetron (ZOFRAN) IV, oxyCODONE, simethicone, zolpidem   Time Spent in minutes  25  See all Orders from today for further details   Jeoffrey Massed M.D on 11/11/2020 at 12:40 PM  To page go to [email or web address] - use universal password  [facility] -  Office  [phone]    Objective:   Vitals:   11/11/20 0300 11/11/20 0400 11/11/20 0415  11/11/20 0928  BP:   97/83 123/77  Pulse:   67 71  Resp:   16 20  Temp:   (!) 97.2 F (36.2 C) 97.6 F (36.4 C)  TempSrc:   Oral Oral  SpO2: 97% 97% 95% 95%  Weight:      Height:        Wt Readings from Last 3 Encounters:   11/09/20 117 kg  06/15/17 104.3 kg  06/10/17 104.3 kg    No intake or output data in the 24 hours ending 11/11/20 1240   Physical Exam Gen Exam:Alert awake-not in any distress HEENT:atraumatic, normocephalic Chest: B/L clear to auscultation anteriorly CVS:S1S2 regular Abdomen:soft non tender, non distended Extremities:no edema Neurology: Non focal Skin: no rash   Data Review:    CBC Recent Labs  Lab 11/07/20 1813 11/08/20 0444 11/09/20 0820 11/10/20 0740 11/11/20 0702  WBC 6.8 6.2 8.8 8.5 8.8  HGB 11.7* 10.7* 10.7* 9.0* 10.0*  HCT 33.5* 31.3* 29.7* 26.5* 29.8*  PLT 146* 125* 154 159 200  MCV 89.6 88.7 87.1 90.1 90.9  MCH 31.3 30.3 31.4 30.6 30.5  MCHC 34.9 34.2 36.0 34.0 33.6  RDW 13.9 14.0 13.9 13.9 14.0  LYMPHSABS 0.5* 0.5* 0.2* 0.7 0.6*  MONOABS 0.1 0.1 0.1 0.2 0.3  EOSABS 0.0 0.0 0.0 0.0 0.0  BASOSABS 0.0 0.0 0.0 0.0 0.0    Chemistries  Recent Labs  Lab 11/07/20 1813 11/08/20 0444 11/08/20 0824 11/09/20 0607 11/10/20 0740 11/11/20 0702  NA 128* 131*  --  136 135 138  K 3.5 3.0*  --  3.8 4.3 4.0  CL 101 104  --  106 106 105  CO2 17* 17*  --  18* 21* 21*  GLUCOSE 92 114*  --  149* 116* 76  BUN 6 8  --  12 11 17   CREATININE 0.76 0.77  --  0.64 0.48 0.71  CALCIUM 7.9* 7.9*  --  8.5* 8.2* 8.6*  MG  --   --  1.8 1.8 1.8  --   AST 33 29  --  29 22 33  ALT 11 11  --  13 10 18   ALKPHOS 96 85  --  84 65 66  BILITOT 0.7 0.4  --  0.4 0.2* 0.4   ------------------------------------------------------------------------------------------------------------------ Recent Labs    11/09/20 0607  CHOL 109  HDL 38*  LDLCALC 52  TRIG 95  CHOLHDL 2.9    Lab Results  Component Value Date   HGBA1C 5.8 (H) 11/09/2020   ------------------------------------------------------------------------------------------------------------------ No results for input(s): TSH, T4TOTAL, T3FREE, THYROIDAB in the last 72 hours.  Invalid input(s):  FREET3 ------------------------------------------------------------------------------------------------------------------ Recent Labs    11/09/20 0607 11/10/20 0740  FERRITIN 127 104    Coagulation profile No results for input(s): INR, PROTIME in the last 168 hours.  Recent Labs    11/10/20 0740 11/11/20 0702  DDIMER 0.49 0.76*    Cardiac Enzymes No results for input(s): CKMB, TROPONINI, MYOGLOBIN in the last 168 hours.  Invalid input(s): CK ------------------------------------------------------------------------------------------------------------------    Component Value Date/Time   BNP 130.9 (H) 11/07/2020 1813    Micro Results Recent Results (from the past 240 hour(s))  Resp Panel by RT-PCR (Flu A&B, Covid) Nasopharyngeal Swab     Status: Abnormal   Collection Time: 11/07/20  6:13 PM   Specimen: Nasopharyngeal Swab; Nasopharyngeal(NP) swabs in vial transport medium  Result Value Ref Range Status   SARS Coronavirus 2 by RT PCR POSITIVE (A) NEGATIVE Final    Comment: RESULT CALLED TO,  READ BACK BY AND VERIFIED WITH: RN V FISHER [phone] MLM (NOTE) SARS-CoV-2 target nucleic acids are DETECTED.  The SARS-CoV-2 RNA is generally detectable in upper respiratory specimens during the acute phase of infection. Positive results are indicative of the presence of the identified virus, but do not rule out bacterial infection or co-infection with other pathogens not detected by the test. Clinical correlation with patient history and other diagnostic information is necessary to determine patient infection status. The expected result is Negative.  Fact Sheet for Patients: BloggerCourse.com  Fact Sheet for Healthcare Providers: SeriousBroker.it  This test is not yet approved or cleared by the [place] FDA and  has been authorized for detection and/or diagnosis of SARS-CoV-2 by FDA under an Emergency Use Authorization  (EUA).  This EUA will remain in effect (meaning this test can be used)  for the duration of  the COVID-19 declaration under Section 564(b)(1) of the Act, 21 U.S.C. section 360bbb-3(b)(1), unless the authorization is terminated or revoked sooner.     Influenza A by PCR NEGATIVE NEGATIVE Final   Influenza B by PCR NEGATIVE NEGATIVE Final    Comment: (NOTE) The Xpert Xpress SARS-CoV-2/FLU/RSV plus assay is intended as an aid in the diagnosis of influenza from Nasopharyngeal swab specimens and should not be used as a sole basis for treatment. Nasal washings and aspirates are unacceptable for Xpert Xpress SARS-CoV-2/FLU/RSV testing.  Fact Sheet for Patients: BloggerCourse.com  Fact Sheet for Healthcare Providers: SeriousBroker.it  This test is not yet approved or cleared by the [place] FDA and has been authorized for detection and/or diagnosis of SARS-CoV-2 by FDA under an Emergency Use Authorization (EUA). This EUA will remain in effect (meaning this test can be used) for the duration of the COVID-19 declaration under Section 564(b)(1) of the Act, 21 U.S.C. section 360bbb-3(b)(1), unless the authorization is terminated or revoked.  Performed at [facility], [address] [place], [place] [address]     Radiology Reports DG Chest 1 View  Result Date: 11/07/2020 CLINICAL DATA:  29 year old female with shortness of breath. EXAM: CHEST  1 VIEW COMPARISON:  Chest radiograph dated 06/28/2008 FINDINGS: There is cardiomegaly with vascular congestion and edema. Pneumonia is not excluded clinical correlation is recommended. There is no pleural effusion pneumothorax. No acute osseous pathology. IMPRESSION: Cardiomegaly with findings of CHF. Pneumonia is not excluded. Electronically Signed   By: Elgie Collard M.D.   On: 11/07/2020 19:04   DG Chest Port 1V same Day  Result Date: 11/10/2020 CLINICAL DATA:  COVID, shortness of  breath, low O2 sats EXAM: PORTABLE CHEST 1 VIEW COMPARISON:  11/07/2020 FINDINGS: Patchy bilateral airspace disease again noted, slightly improved since prior study. Heart is normal size. No effusions or pneumothorax. No acute bony abnormality. IMPRESSION: Patchy bilateral airspace disease, slightly improved since prior study. Electronically Signed   By: Charlett Nose M.D.   On: 11/10/2020 08:15   [place] MFM FETAL BPP WO NON STRESS  Result Date: 11/08/2020 ----------------------------------------------------------------------  OBSTETRICS REPORT                        (Signed Final 11/08/2020 08:44 am) ---------------------------------------------------------------------- Patient Info  ID #:       [id number]                          D.O.B.:  Oct 02, 1991 (29 yrs)  Name:       [name]  Visit Date: 11/08/2020 01:56 am ---------------------------------------------------------------------- Performed By  Attending:        Lin Landsman      Ref. Address:      [address]                    [place]                                                              [place] [id number]                                                              [place]                                                              [address]  Performed By:     Earley Brooke     Secondary Phy.:    [facility]                    BS, RDMS                                                              Care  Referred By:      Henderson Newcomer             [facility] ---------------------------------------------------------------------- Orders  #  Description                           Code        Ordered By  1  [place] MFM FETAL BPP WO NON               76819.01    NANCY PROTHERO     STRESS ----------------------------------------------------------------------  #  Order #                     Accession #                Episode #  1  [id number]                    [id number]                 [id number] ---------------------------------------------------------------------- Indications  Medical complication of  pregnancy (Covid +      O26.90  11-07-20)  [redacted] weeks gestation of pregnancy                 Z3A.36  Preterm contractions                            O47.00  Fetal heart rate decelerations affecting        O36.8390  management of mother ---------------------------------------------------------------------- Fetal Evaluation  Num Of Fetuses:          1  Fetal Heart Rate(bpm):   171  Cardiac Activity:        Observed  Presentation:            Cephalic  Amniotic Fluid  AFI FV:      Within normal limits  AFI Sum(cm)     %Tile       Largest Pocket(cm)  16.9            63          5.2  RUQ(cm)       RLQ(cm)       LUQ(cm)        LLQ(cm)  3.9           3.5           4.3            5.2 ---------------------------------------------------------------------- Biophysical Evaluation  Amniotic F.V:   Within normal limits       F. Tone:         Observed  F. Movement:    Observed                   Score:           8/8  F. Breathing:   Observed ---------------------------------------------------------------------- OB History  Gravidity:    1         Term:   0        Prem:   0        SAB:   0  TOP:          0       Ectopic:  0        Living: 0 ---------------------------------------------------------------------- Gestational Age  Clinical EDD:  36w 3d                                        EDD:   12/03/20  Best:          36w 3d     Det. By:  Clinical EDD             EDD:   12/03/20 ---------------------------------------------------------------------- Impression  Antenatal testing due to variable decelerations seen on a  reactive tracing  Biophysical profile 8/8 with good fetal movement and  amniotic fluid volume. ---------------------------------------------------------------------- Recommendations  Follow up as clinically indicated.  ----------------------------------------------------------------------               Lin Landsman, MD Electronically Signed Final Report   11/08/2020 08:44 am ----------------------------------------------------------------------  ECHOCARDIOGRAM COMPLETE  Result Date: 11/08/2020    ECHOCARDIOGRAM REPORT   Patient Name:   [name] Date of Exam: 11/08/2020 Medical Rec #:  [id number]       Height:       64.0 in Accession #:    [id number]  Weight:       257.9 lb Date of Birth:  12-Aug-1991       BSA:          2.180 m Patient Age:    29 years        BP:           116/69 mmHg Patient Gender: F               HR:           79 bpm. Exam Location:  Inpatient Procedure: 2D Echo, Cardiac Doppler and Color Doppler Indications:    I42.9 Cardiomyopathy (unspecified)  History:        Patient has no prior history of Echocardiogram examinations.                 Signs/Symptoms:Dyspnea and Shortness of Breath; Risk                 Factors:Diabetes. Covid positive. [redacted] weeks pregnant.  Sonographer:    Sheralyn Boatman RDCS Referring Phys: [phone] JARED M GARDNER  Sonographer Comments: Technically difficult study due to poor echo windows and patient is morbidly obese. Image acquisition challenging due to patient body habitus. IMPRESSIONS  1. Left ventricular ejection fraction, by estimation, is 65 to 70%. Left ventricular ejection fraction by 2D MOD biplane is 65.7 %. The left ventricle has hyperdynamic function. The left ventricle has no regional wall motion abnormalities. There is mild  concentric left ventricular hypertrophy. Left ventricular diastolic parameters were normal.  2. Right ventricular systolic function is normal. The right ventricular size is normal.  3. The mitral valve is grossly normal. No evidence of mitral valve regurgitation.  4. The aortic valve is grossly normal. Aortic valve regurgitation is not visualized. No aortic stenosis is present.  5. The inferior vena cava is normal in size with greater than 50%  respiratory variability, suggesting right atrial pressure of 3 mmHg. Comparison(s): No prior Echocardiogram. FINDINGS  Left Ventricle: Left ventricular ejection fraction, by estimation, is 65 to 70%. Left ventricular ejection fraction by 2D MOD biplane is 65.7 %. The left ventricle has hyperdynamic function. The left ventricle has no regional wall motion abnormalities. The left ventricular internal cavity size was normal in size. There is mild concentric left ventricular hypertrophy. Left ventricular diastolic parameters were normal. Right Ventricle: The right ventricular size is normal. No increase in right ventricular wall thickness. Right ventricular systolic function is normal. Left Atrium: Left atrial size was normal in size. Right Atrium: Right atrial size was normal in size. Pericardium: Trivial pericardial effusion is present. Mitral Valve: The mitral valve is grossly normal. No evidence of mitral valve regurgitation. Tricuspid Valve: The tricuspid valve is grossly normal. Tricuspid valve regurgitation is not demonstrated. Aortic Valve: The aortic valve is grossly normal. Aortic valve regurgitation is not visualized. No aortic stenosis is present. Pulmonic Valve: The pulmonic valve was not well visualized. Pulmonic valve regurgitation is not visualized. Aorta: The aortic root and ascending aorta are structurally normal, with no evidence of dilitation. Venous: The pulmonary veins were not well visualized. The inferior vena cava is normal in size with greater than 50% respiratory variability, suggesting right atrial pressure of 3 mmHg. IAS/Shunts: The atrial septum is grossly normal.  LEFT VENTRICLE PLAX 2D                        Biplane EF (MOD) LVIDd:         4.70 cm  LV Biplane EF:   Left LVIDs:         3.10 cm                          ventricular LV PW:         1.20 cm                          ejection LV IVS:        1.00 cm                          fraction by LVOT diam:     1.90 cm                           2D MOD LV SV:         81                               biplane is LV SV Index:   37                               65.7 %. LVOT Area:     2.84 cm                                Diastology                                LV e' medial:    5.66 cm/s LV Volumes (MOD)               LV E/e' medial:  9.2 LV vol d, MOD    96.4 ml       LV e' lateral:   11.65 cm/s A2C:                           LV E/e' lateral: 4.5 LV vol d, MOD    80.2 ml A4C: LV vol s, MOD    28.5 ml A2C: LV vol s, MOD    30.3 ml A4C: LV SV MOD A2C:   67.9 ml LV SV MOD A4C:   80.2 ml LV SV MOD BP:    60.5 ml RIGHT VENTRICLE             IVC RV S prime:     14.80 cm/s  IVC diam: 1.40 cm TAPSE (M-mode): 2.6 cm LEFT ATRIUM             Index       RIGHT ATRIUM           Index LA diam:        2.50 cm 1.15 cm/m  RA Area:     11.70 cm LA Vol (A2C):   24.1 ml 11.06 ml/m RA Volume:   23.70 ml  10.87 ml/m LA Vol (A4C):   17.5 ml 8.03 ml/m LA Biplane Vol: 20.4 ml 9.36 ml/m  AORTIC VALVE LVOT Vmax:   131.00 cm/s LVOT Vmean:  96.000 cm/s LVOT VTI:    0.286 m  AORTA Ao Root diam: 3.30 cm Ao Asc diam:  3.10 cm MITRAL VALVE MV Area (PHT): 3.40 cm    SHUNTS MV Decel Time: 223 msec    Systemic VTI:  0.29 m MV E velocity: 52.16 cm/s  Systemic Diam: 1.90 cm MV A velocity: 77.60 cm/s MV E/A ratio:  0.67 Riley Lam MD Electronically signed by Riley Lam MD Signature Date/Time: 11/08/2020/12:00:13 PM    Final

## 2020-11-11 NOTE — Lactation Note (Signed)
This note was copied from a baby's chart. Lactation Consultation Note  Patient Name: [name] [id number] Date: 11/11/2020 Reason for consult: Follow-up assessment Age:28 hours  0830 - Lactation attempted to see [name]. I entered the room and she appeared to be asleep. Her support person also appeared to be asleep. Baby was in his bassinet semi-alert.  I stated her name several times in an attempt to wake her, but she did not rouse. I followed up with the RN post visit and indicated that she should call lactation as needed today.  Consult Status Consult Status: Follow-up Date: 11/11/20 Follow-up type: In-patient    Walker Shadow 11/11/2020, 8:57 AM

## 2020-12-03 ENCOUNTER — Inpatient Hospital Stay (HOSPITAL_COMMUNITY): Admit: 2020-12-03 | Payer: Self-pay

## 2021-05-12 IMAGING — US US MFM FETAL BPP W/O NON-STRESS
1 series · 10 of 10 positions shown · non-contrast
Comparison: none

[Series 1: us mfm fetal bpp w/o non-stress · 10 acquisitions, 10 frames shown]
[im 1/10]
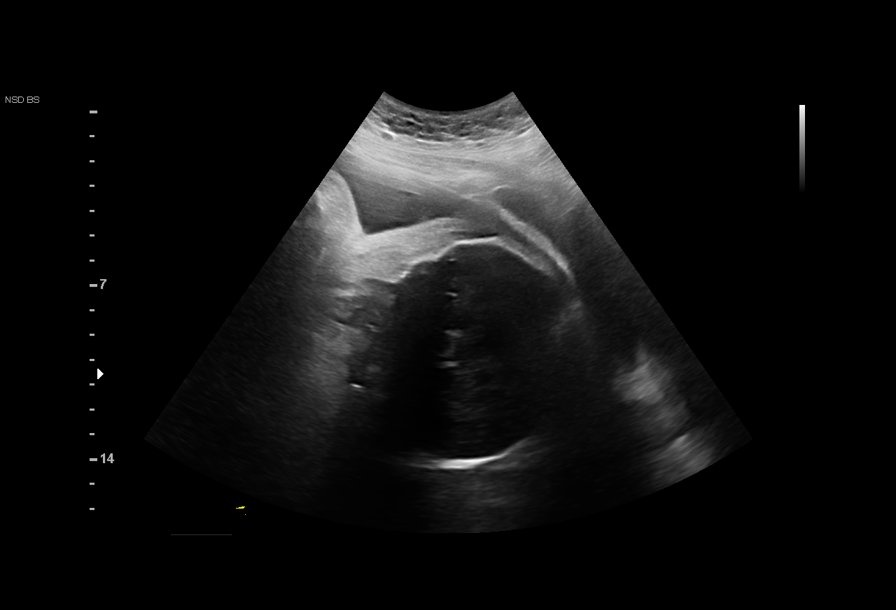
[im 2/10]
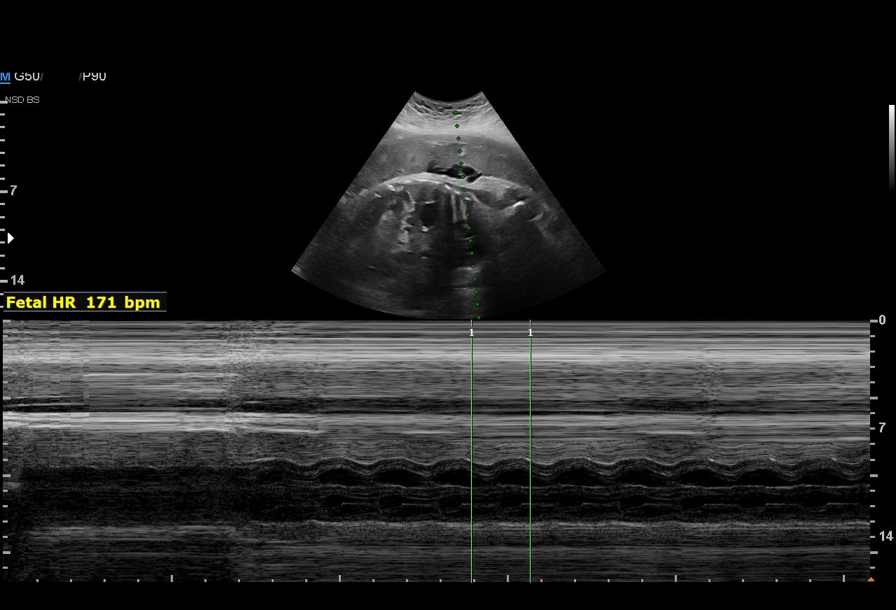
[im 3/10]
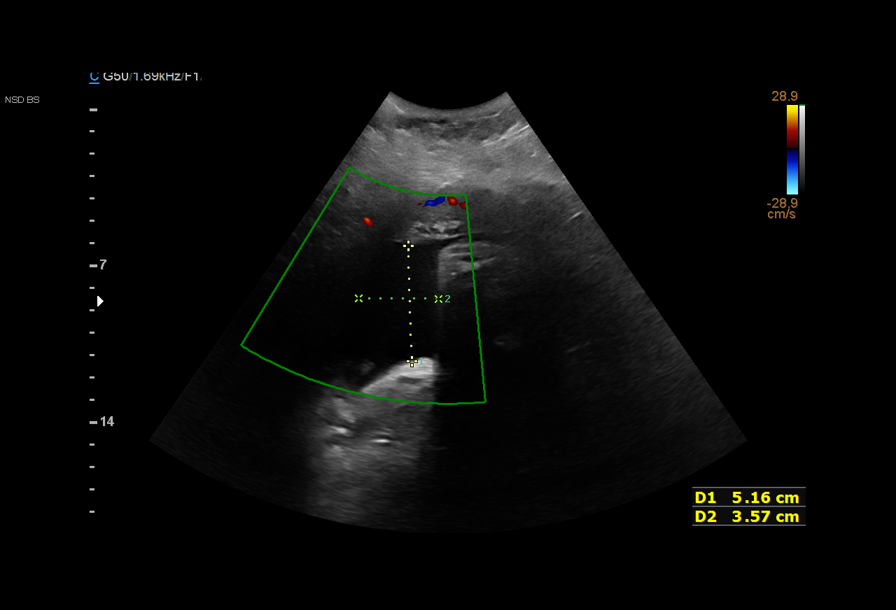
[im 4/10]
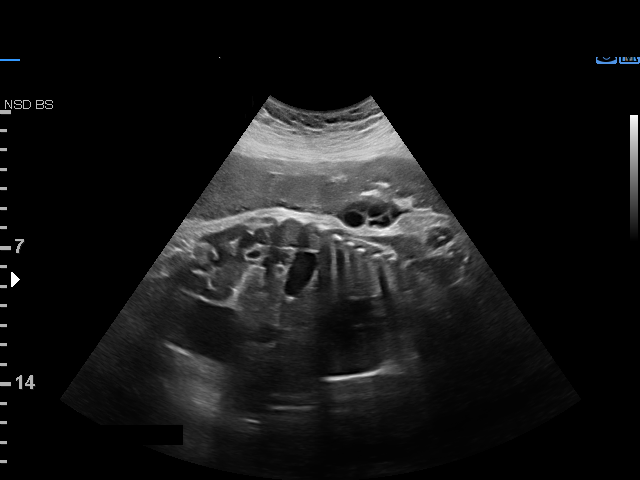
[im 5/10]
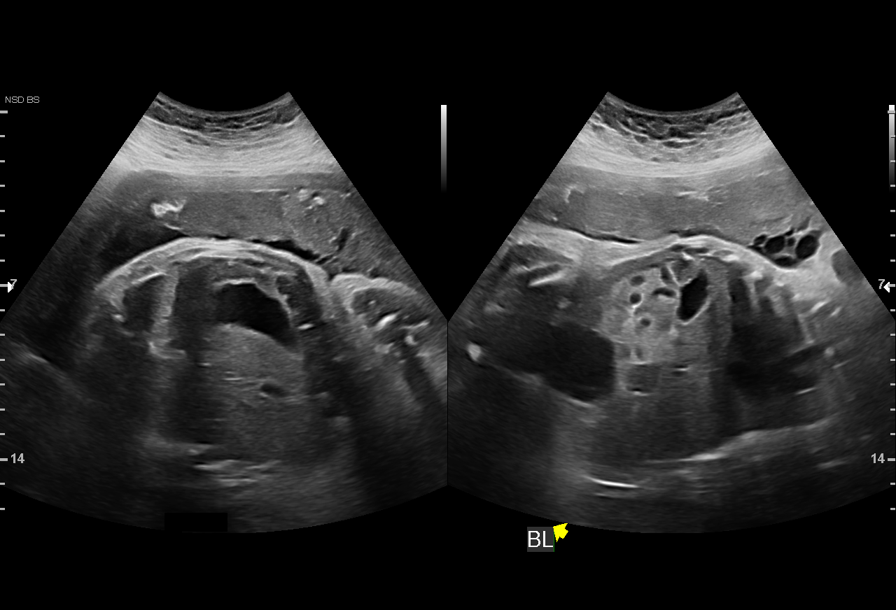
[im 6/10]
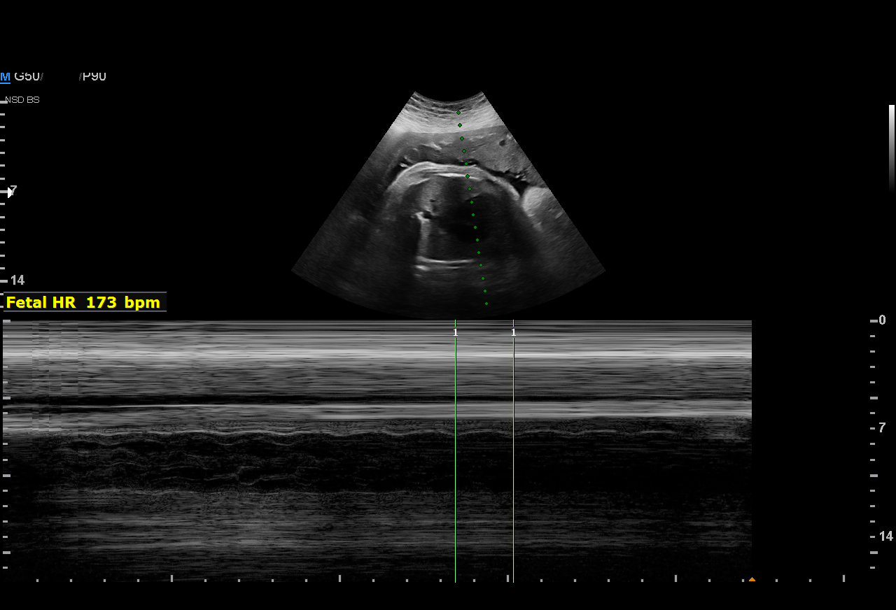
[im 7/10]
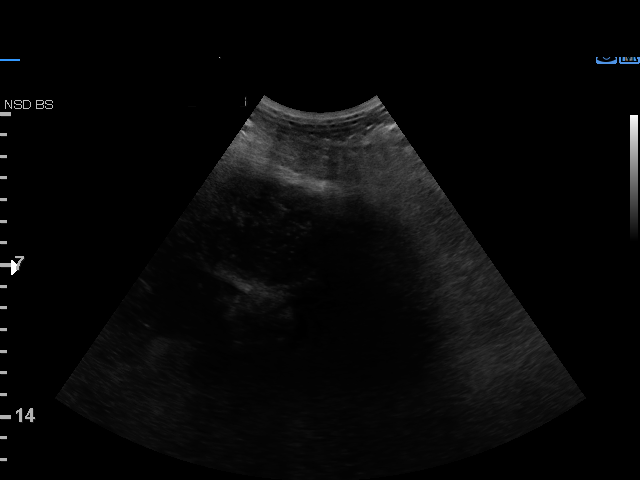
[im 8/10]
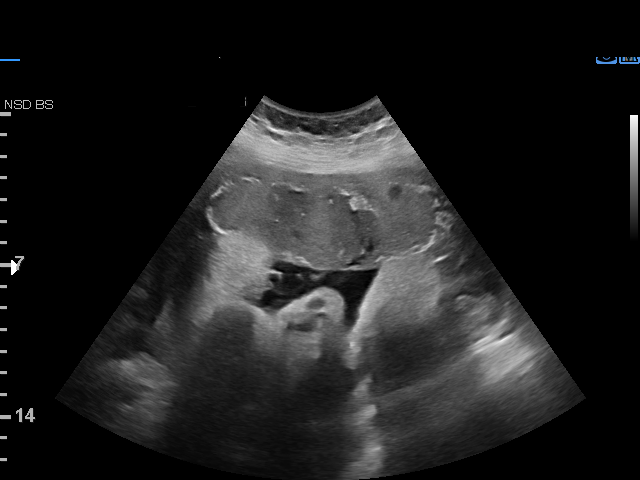
[im 9/10]
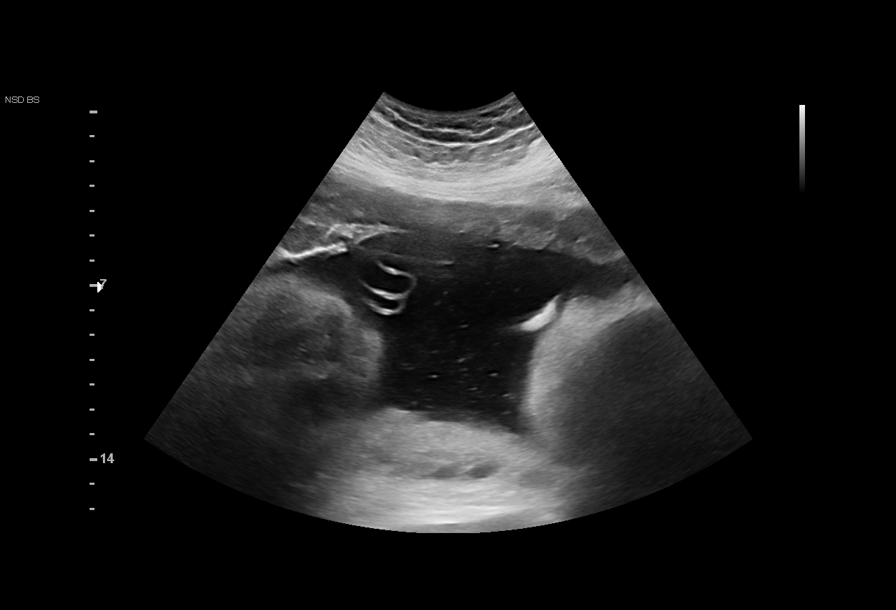
[im 10/10]
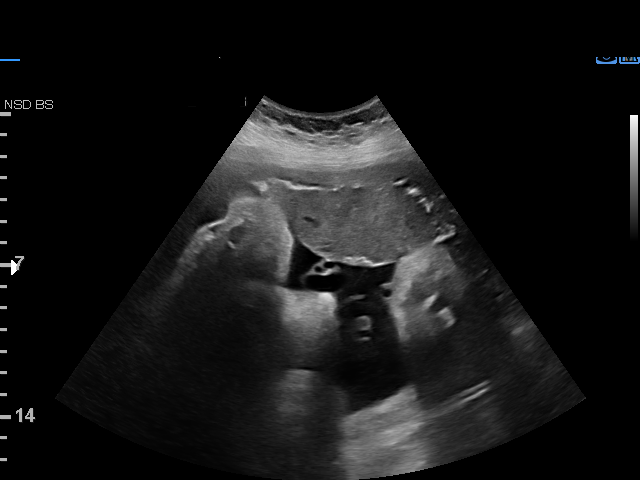

[10 of 10 positions shown; findings below may reference images not displayed]

Care
 Referred By:      AZIZ MOHAMMAD ANSAH             [facility]          [facility]
                   [facility]                              [HOSPITAL]

Indications

 Medical complication of pregnancy (Covid +
 11-07-20)
 36 weeks gestation of pregnancy
 Preterm contractions
 Fetal heart rate decelerations affecting
 management of mother
Fetal Evaluation

 Num Of Fetuses:          1
 Fetal Heart Rate(bpm):   171
 Cardiac Activity:        Observed
 Presentation:            Cephalic

 Amniotic Fluid
 AFI FV:      Within normal limits

 AFI Sum(cm)     %Tile       Largest Pocket(cm)
 16.9            63

 RUQ(cm)       RLQ(cm)       LUQ(cm)        LLQ(cm)

Biophysical Evaluation

 Amniotic F.V:   Within normal limits       F. Tone:         Observed
 F. Movement:    Observed                   Score:           [DATE]
 F. Breathing:   Observed
OB History

 Gravidity:    1         Term:   0        Prem:   0        SAB:   0
 TOP:          0       Ectopic:  0        Living: 0
Gestational Age

 Clinical EDD:  36w 3d                                        EDD:   12/03/20
 Best:          36w 3d     Det. By:  Clinical EDD             EDD:   12/03/20
Impression

 Antenatal testing due to variable decelerations seen on a
 reactive tracing
 Biophysical profile [DATE] with good fetal movement and
 amniotic fluid volume.
Recommendations

 Follow up as clinically indicated.

## 2021-05-14 IMAGING — DX DG CHEST 1V PORT SAME DAY
1 series · 1 of 1 positions shown · non-contrast
Comparison: 11/07/2020

CLINICAL DATA: COVID, shortness of breath, low O2 sats

EXAM:
PORTABLE CHEST 1 VIEW

[chest ap]
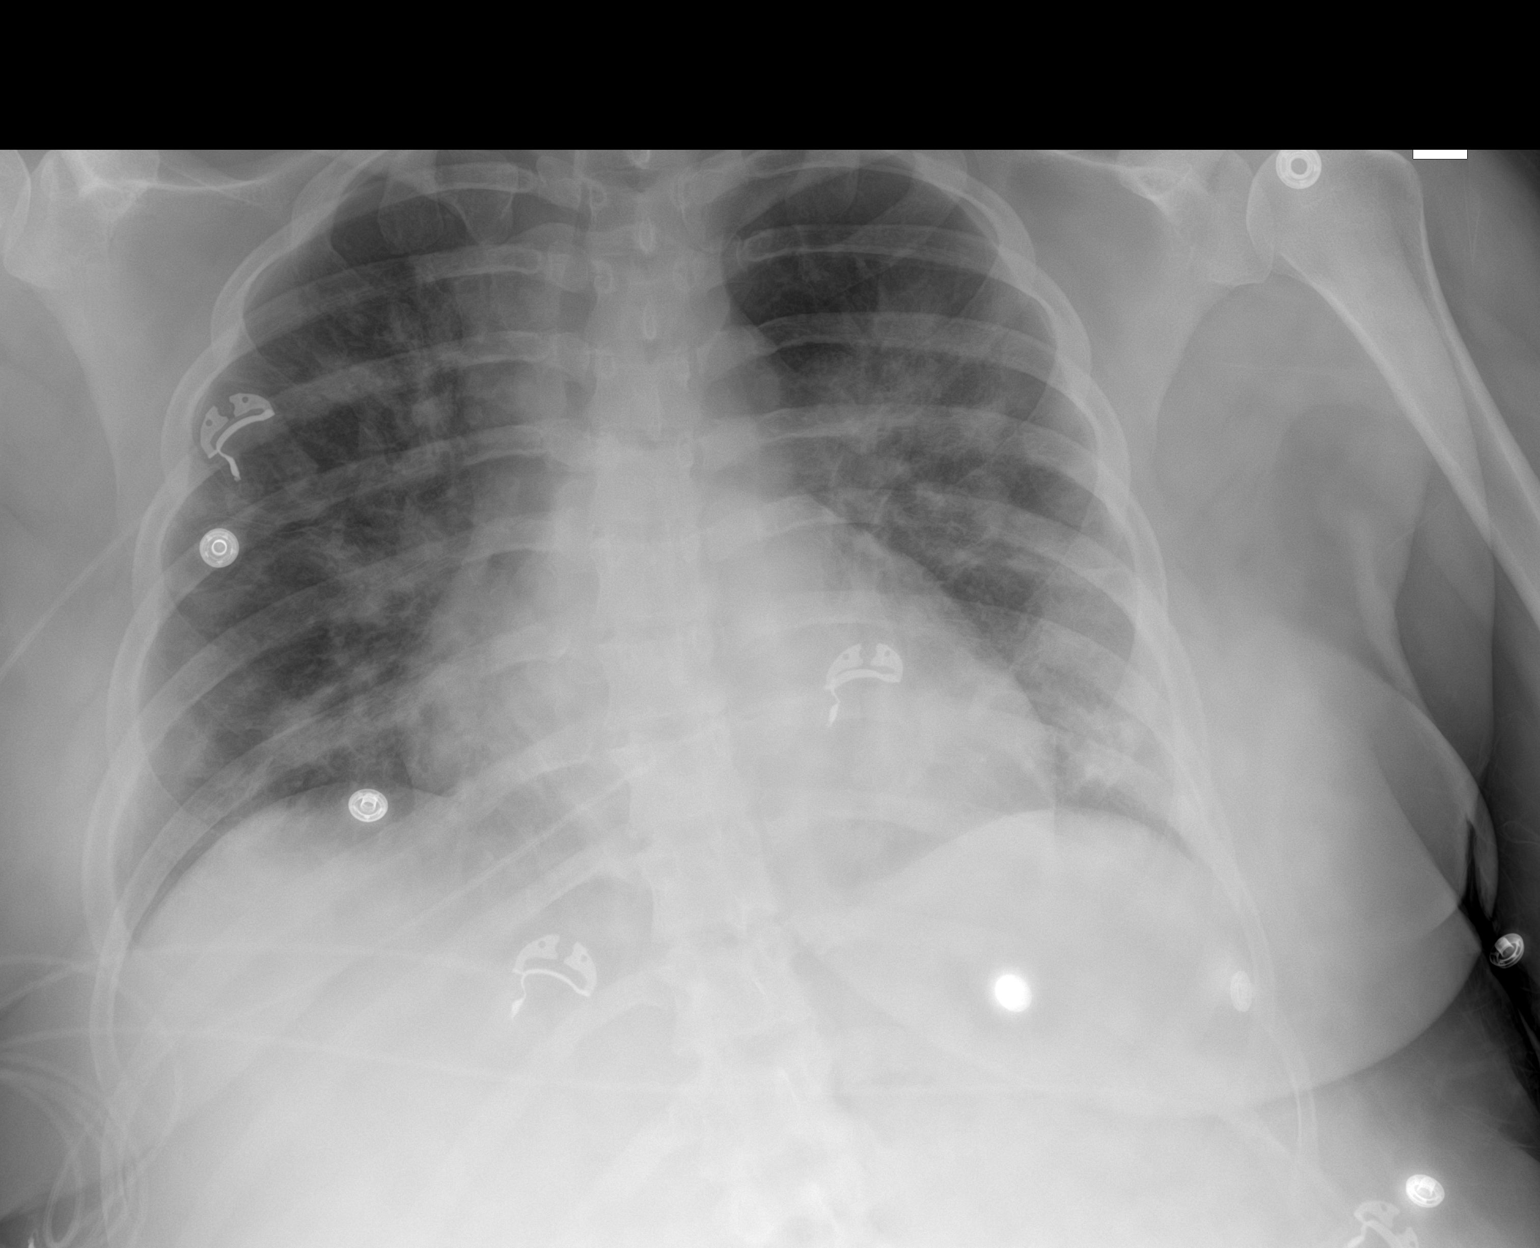

[1 of 1 positions shown; findings below may reference images not displayed]

FINDINGS: Patchy bilateral airspace disease again noted, slightly improved
since prior study. Heart is normal size. No effusions or
pneumothorax. No acute bony abnormality.
IMPRESSION: Patchy bilateral airspace disease, slightly improved since prior
study.

## 2021-09-02 ENCOUNTER — Other Ambulatory Visit: Payer: Self-pay | Admitting: Obstetrics and Gynecology

## 2021-09-02 DIAGNOSIS — Z363 Encounter for antenatal screening for malformations: Secondary | ICD-10-CM

## 2021-09-05 ENCOUNTER — Other Ambulatory Visit: Payer: Self-pay

## 2021-09-05 ENCOUNTER — Ambulatory Visit: Payer: Medicaid Other | Attending: Obstetrics and Gynecology

## 2021-09-05 ENCOUNTER — Ambulatory Visit: Payer: Medicaid Other | Admitting: *Deleted

## 2021-09-05 ENCOUNTER — Ambulatory Visit (HOSPITAL_BASED_OUTPATIENT_CLINIC_OR_DEPARTMENT_OTHER): Payer: Medicaid Other | Admitting: Obstetrics

## 2021-09-05 ENCOUNTER — Ambulatory Visit: Payer: Medicaid Other

## 2021-09-05 ENCOUNTER — Other Ambulatory Visit: Payer: Self-pay | Admitting: Obstetrics and Gynecology

## 2021-09-05 ENCOUNTER — Encounter: Payer: Self-pay | Admitting: *Deleted

## 2021-09-05 VITALS — BP 125/61 | HR 78

## 2021-09-05 DIAGNOSIS — O98512 Other viral diseases complicating pregnancy, second trimester: Secondary | ICD-10-CM | POA: Insufficient documentation

## 2021-09-05 DIAGNOSIS — O99332 Smoking (tobacco) complicating pregnancy, second trimester: Secondary | ICD-10-CM | POA: Diagnosis not present

## 2021-09-05 DIAGNOSIS — O09522 Supervision of elderly multigravida, second trimester: Secondary | ICD-10-CM

## 2021-09-05 DIAGNOSIS — O2692 Pregnancy related conditions, unspecified, second trimester: Secondary | ICD-10-CM | POA: Diagnosis not present

## 2021-09-05 DIAGNOSIS — B009 Herpesviral infection, unspecified: Secondary | ICD-10-CM | POA: Diagnosis not present

## 2021-09-05 DIAGNOSIS — Z3A20 20 weeks gestation of pregnancy: Secondary | ICD-10-CM | POA: Insufficient documentation

## 2021-09-05 DIAGNOSIS — O09892 Supervision of other high risk pregnancies, second trimester: Secondary | ICD-10-CM | POA: Diagnosis not present

## 2021-09-05 DIAGNOSIS — O283 Abnormal ultrasonic finding on antenatal screening of mother: Secondary | ICD-10-CM

## 2021-09-05 DIAGNOSIS — O99212 Obesity complicating pregnancy, second trimester: Secondary | ICD-10-CM | POA: Insufficient documentation

## 2021-09-05 DIAGNOSIS — O359XX0 Maternal care for (suspected) fetal abnormality and damage, unspecified, not applicable or unspecified: Secondary | ICD-10-CM

## 2021-09-05 DIAGNOSIS — O288 Other abnormal findings on antenatal screening of mother: Secondary | ICD-10-CM

## 2021-09-05 DIAGNOSIS — Z363 Encounter for antenatal screening for malformations: Secondary | ICD-10-CM

## 2021-09-05 DIAGNOSIS — O34212 Maternal care for vertical scar from previous cesarean delivery: Secondary | ICD-10-CM | POA: Insufficient documentation

## 2021-09-05 NOTE — Progress Notes (Signed)
MFM Notes  [name] was seen for a detailed fetal anatomy scan due to maternal obesity and as multiple fetal anomalies were noted on a recent ultrasound performed in your office.  She denies any significant past medical history and denies any problems in her current pregnancy.    The patient just had a cell free DNA test drawn a few days ago after she had the abnormal ultrasound.  The results of that test are currently pending.  She was informed that the fetal growth and amniotic fluid level were appropriate for her gestational age.   On today's exam, an abnormally shaped head (lemon sign) with bilateral ventriculomegaly and a possible banana sign were noted in the fetal head.  The patient was advised that these findings may be consistent with spina bifida.  Multiple other fetal anomalies including clenched fists, a single umbilical artery, a possible missing left kidney, right pyelectasis, an abnormally shaped spine, and rocker-bottom feet were also noted.  The patient was advised that the multiple anomalies noted today may be a sign of trisomy 16.  Due to my concerns regarding a fetal chromosomal abnormality due to today's ultrasound findings, I recommended that the patient undergo an amniocentesis for definitive diagnosis of fetal chromosomal abnormalities.  After all risks versus benefits of the amniocentesis procedure were discussed, the patient agreed to undergo an amniocentesis today.  After informed consent was obtained, a timeout was performed verifying the patient's identity and the indications for the procedure.  The patient was then prepped and draped in the usual sterile fashion.  An uncomplicated genetic amniocentesis was performed today obtaining 25 cc of straw-colored amniotic fluid which was sent for amniotic fluid AFP, amniotic fluid acetylcholinesterase, FISH, and chromosome analysis with reflex MicroArray.  The patient was advised that our genetic counselor will notify  her regarding the results of the amniocentesis.  Post amniocentesis instructions were discussed.  As the patient's blood type is Rh positive, a dose of RhoGam was not given following the procedure.  The patient was advised that we will await the amniocentesis results before recommending any further management recommendations.  Should spina bifida be confirmed and the fetal chromosomes are within normal limits, we will consider a fetal MRI for further fetal assessment and will consider referral to [facility] to discuss possible in utero surgical repair of spina bifida.  However should a chromosomal abnormality be noted on her amniocentesis, the option of pregnancy termination in light of all of the fetal anomalies noted today was discussed.  She will consider termination of pregnancy should a chromosomal abnormality be noted.  We will refer her to the appropriate facility for the termination procedure should she want to terminate her pregnancy.  We will contact the patient once the amniocentesis results are available in a few days.  The patient stated that all of her questions had been answered.  A total of 30 minutes was spent counseling and coordinating the care for this patient.  Greater than 50% of the time was spent in direct face-to-face contact.

## 2021-09-09 ENCOUNTER — Telehealth: Payer: Self-pay | Admitting: Genetics

## 2021-09-09 LAB — OTHER LAB TEST

## 2021-09-09 NOTE — Telephone Encounter (Signed)
10/27: Reviewed amniocentesis AF-AFP results with Santina Evans. The AF-AFP result is elevated for the gestational age at 68.16 MoM. Given this result, a neural tube defect is suspected. Two additional assays, acetylcholinesterase and fetal hemoglobin, are being performed on this fluid and the test results will be communicated as soon as they are available. The fetal FISH, karyotype, and microarray are still pending.   10/28: Edmon Crape to return fetal FISH results. Left voicemail with genetic counseling call back number.

## 2021-09-12 ENCOUNTER — Ambulatory Visit: Payer: Medicaid Other

## 2021-09-27 DIAGNOSIS — Z98891 History of uterine scar from previous surgery: Secondary | ICD-10-CM | POA: Insufficient documentation

## 2021-09-27 LAB — CHROMOSOME MICROARRAY REFLEX, AMN FLD

## 2021-09-27 LAB — AFP, AMNIOTIC FLUID
AFP, Amniotic Fluid (mcg/ml): 80.6 ug/mL
Gestational Age(Wks): 20
MOM, Amniotic Fluid: 12.16

## 2021-09-27 LAB — ACHE+HB F: ACHE, Amn: POSITIVE — AB

## 2021-09-27 LAB — CHROMOSOME ANALYSIS W REFLEX TO SNP, AMNIOTIC
Cells Analyzed: 15
Cells Counted: 15
Cells Karyotyped: 2
Colonies: 15
GTG Band Resolution Achieved: 450

## 2021-09-27 LAB — INSIGHT AMNIO FISH XY,13,18,21
Cells Analyzed: 50
Cells Counted: 50

## 2022-01-11 ENCOUNTER — Inpatient Hospital Stay (HOSPITAL_COMMUNITY): Admit: 2022-01-11 | Payer: Medicaid Other | Admitting: Obstetrics and Gynecology

## 2022-01-11 ENCOUNTER — Encounter (HOSPITAL_COMMUNITY): Payer: Self-pay

## 2022-01-11 SURGERY — Surgical Case
Anesthesia: Choice

## 2022-03-09 IMAGING — US US MFM OB DETAIL+14 WK
1 series · 6 of 28 positions shown · non-contrast
Comparison: none

[Series 1: us mfm ob detail+14 wk · 6 of 132 slices shown]
[im 10/132]
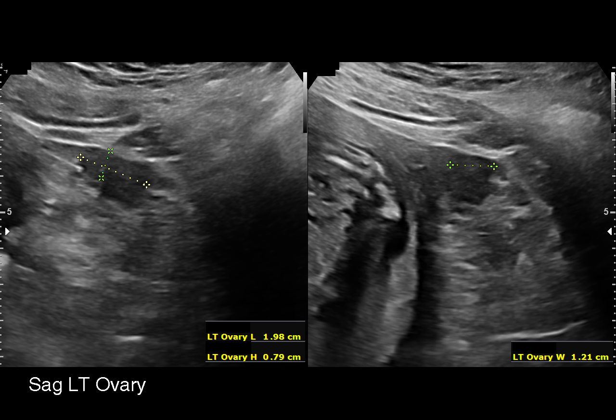
[im 34/132]
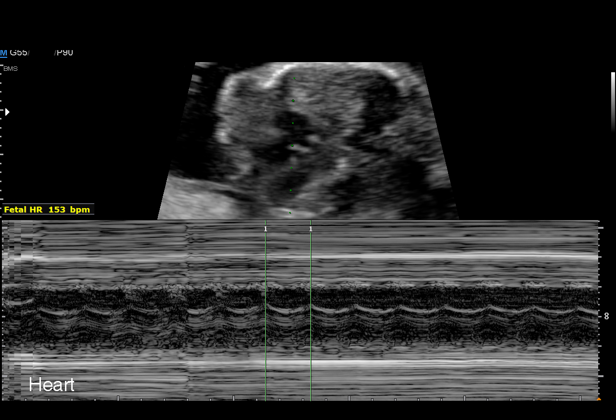
[im 54/132]
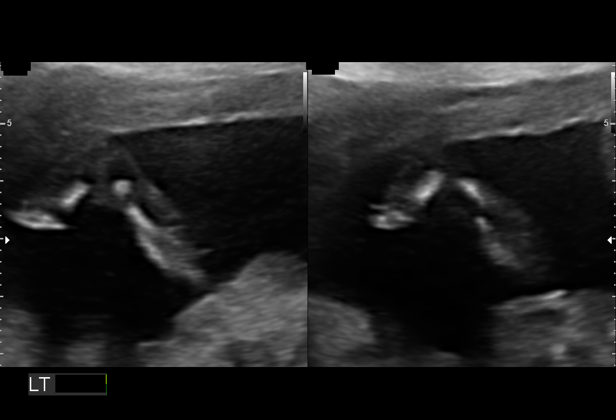
[im 78/132]
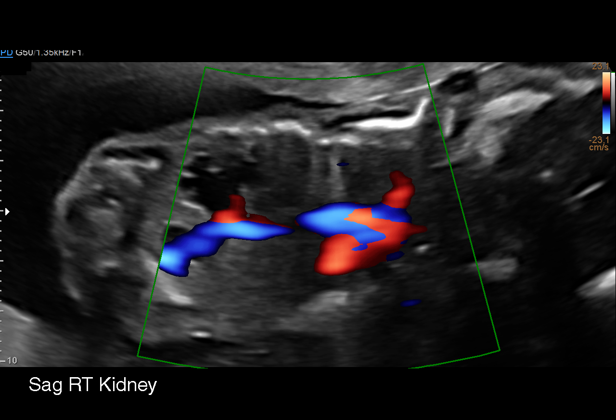
[im 102/132]
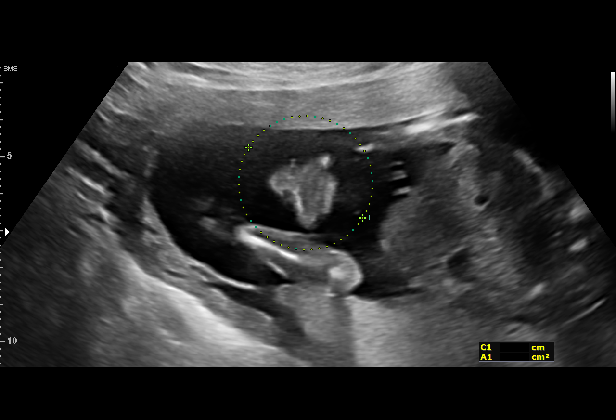
[im 122/132]
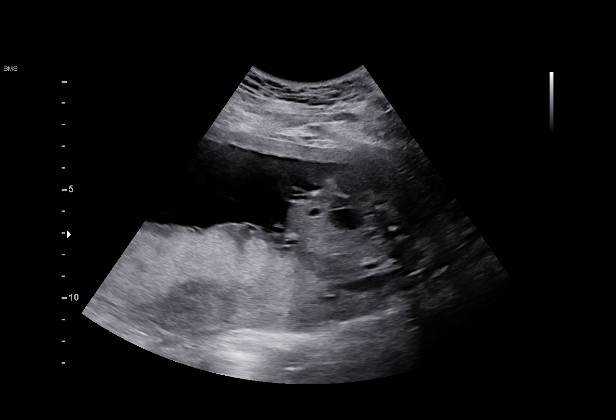

[6 of 28 positions shown; findings below may reference images not displayed]

Addendum:\.br----------------------------------------------------------------------
----------------------------------------------------------------------

----------------------------------------------------------------------

                                                            [REDACTED]
                                                            Ave., [HOSPITAL]
----------------------------------------------------------------------

 2  US MFM AMNIOCENTESIS                  [id number]    [facility] [place]
----------------------------------------------------------------------

----------------------------------------------------------------------
Indications

 Obesity complicating pregnancy, second
 trimester (BMI 41)
 Encounter for antenatal screening for
 malformations
 NIPS/Horizon drawn 09/01/21
 Abnormal fetal ultrasound
 Medical complication of pregnancy
 (Pseudotumor cerebri)
 Short interval between pregancies, 2nd
 trimester
 Herpes simplex virus (TESSEMA)
 Tobacco use complicating pregnancy,
 second trimester
 History of cesarean delivery, currently
 pregnant (x1)
 Poor obstetric history: Previous preterm
 delivery, antepartum (61w2d)
 20 weeks gestation of pregnancy
----------------------------------------------------------------------
Fetal Evaluation

 Num Of Fetuses:         1
 Fetal Heart Rate(bpm):  153
 Cardiac Activity:       Observed
 Presentation:           Cephalic
 Placenta:               Posterior
 P. Cord Insertion:      Not well visualized
 Amniotic Fluid
 AFI FV:      Within normal limits

                             Largest Pocket(cm)

----------------------------------------------------------------------
Biometry

 BPD:      44.6  mm     G. Age:  19w 3d          9  %    CI:        60.01   %    70 - 86
                                                         FL/HC:      17.3   %    15.9 -
 HC:      186.7  mm     G. Age:  21w 0d         54  %    HC/AC:      1.14        1.06 -
 AC:      163.2  mm     G. Age:  21w 3d         66  %    FL/BPD:     72.4   %
 FL:       32.3  mm     G. Age:  20w 0d         21  %    FL/AC:      19.8   %    20 - 24
 HUM:      30.9  mm     G. Age:  20w 2d         36  %
 CER:      19.8  mm     G. Age:  19w 1d         15  %
 LV:       14.2  mm
 CM:        4.9  mm

 Est. FW:     375  gm    0 lb 13 oz      47  %
----------------------------------------------------------------------
OB History

 Gravidity:    2
 Living:       1
----------------------------------------------------------------------
Gestational Age

 LMP:           23w 0d        Date:  03/28/21                 EDD:   01/02/22
 U/S Today:     20w 3d                                        EDD:   01/20/22
 Best:          20w 5d     Det. By:  Early Ultrasound         EDD:   01/18/22
                                     (05/30/21)
----------------------------------------------------------------------
Anatomy

 Cranium:               Abnormal, see          Aortic Arch:            Abnormal, see
                        comments                                       comments
 Cavum:                 Appears normal         Ductal Arch:            Not well visualized
 Ventricles:            Ventriculomegaly,      Diaphragm:              Not well visualized
                        14.2mm
 Choroid Plexus:        Dangling Choroid       Stomach:                Appears normal, left
                                                                       sided
 Cerebellum:            Banana sign            Abdomen:                Not well visualized
 Posterior Fossa:       Abnormal, see          Abdominal Wall:         Appears nml (cord
                        comments
                                                                       insert, abd wall)
 Nuchal Fold:           Not applicable (>20    Cord Vessels:           2 Vessel Cord
                        wks GA)
 Face:                  Orbits nl; profile not Kidneys:                Abnormal, see
                        well visualized
                                                                       comments
 Lips:                  Appears normal         Bladder:                Appears normal
 Heart:                 VSD                    Spine:                  Abnormal, see
                                                                       comments
 RVOT:                  Not well visualized    Upper Extremities:      Clenched fingers
 LVOT:                  Not well visualized    Lower Extremities:      Abnormal, see
                                                                       comments

 Other:  Technically difficult due to maternal habitus.
----------------------------------------------------------------------
Guided Procedures
 Type:   Amniocentesis
 FH Pre Procedure:       153
 FH Post Procedure:      155
 Rh Type:                B+
 Rh Immunoglobulin:      Not required, Rh positive
 Discharge Instructions: Post-procedure instructions given
 Needle Insertions:      20 gauge x 1
 Vol. Withdrawn:         25 ml of clear amniotic fluid
 Transabdominal:         Yes
 Complications:  None
 Comment:        Informed consent was obtained. A "time-out" was
                 performed before the procedure. Patient tolerated the
                 procedure well. We gave her post-procedure instructions.
----------------------------------------------------------------------
Cervix Uterus Adnexa

 Cervix
 Length:           3.67  cm.
 Not visualized (advanced GA >36wks)

 Uterus
 No abnormality visualized.

 Right Ovary
 Within normal limits.

 Left Ovary
 Within normal limits.

 Cul De Sac
 No free fluid seen.

 Adnexa
 No adnexal mass visualized.
----------------------------------------------------------------------
Comments

 Eitan Almazan was seen for a detailed fetal anatomy scan
 due to maternal obesity and as multiple fetal anomalies were
 noted on a recent ultrasound performed in your office.
 She denies any significant past medical history and denies
 any problems in her current pregnancy.
 The patient just had a cell free DNA test drawn a few days
 ago after she had the abnormal ultrasound.  The results of
 that test are currently pending.
 She was informed that the fetal growth and amniotic fluid
 level were appropriate for her gestational age.
 On today's exam, an abnormally shaped head (Chai sign)
 with bilateral ventriculomegaly and a possible banana sign
 were noted in the fetal head.  The patient was advised that
 these findings may be consistent with spina bifida.  Multiple
 other fetal anomalies including clenched fists, a single
 umbilical artery, a possible missing left kidney, right
 pyelectasis, an abnormally shaped spine, and Oom
 feet were also noted.
 The patient was advised that the multiple anomalies noted
 today may be a sign of trisomy 18.  Due to my concerns
 regarding a fetal chromosomal abnormality due to today's
 ultrasound findings, I recommended that the patient undergo
 an amniocentesis for definitive diagnosis of fetal
 chromosomal abnormalities.
 After all risks versus benefits of the amniocentesis procedure
 were discussed, the patient agreed to undergo an
 amniocentesis today.
 After informed consent was obtained, a timeout was
 performed verifying the patient's identity and the indications
 for the procedure.  The patient was then prepped and draped
 in the usual sterile fashion.
 An uncomplicated genetic amniocentesis was performed
 today obtaining 25 cc of straw-colored amniotic fluid which
 was sent for amniotic fluid AFP, amniotic fluid
 acetylcholinesterase, FISH, and chromosome analysis with
 reflex MicroArray.  The patient was advised that our genetic
 counselor will notify her regarding the results of the
 amniocentesis.  Post amniocentesis instructions were
 discussed.  As the patient's blood type is Rh positive, a dose
 of RhoGam was not given following the procedure.
 The patient was advised that we will await the amniocentesis
 results before recommending any further management
 recommendations.  Should spina bifida be confirmed and the
 fetal chromosomes are within normal limits, we will consider a
 fetal MRI for further fetal assessment and will consider
 referral to [facility] to discuss possible in utero
 surgical repair of spina bifida.
 However should a chromosomal abnormality be noted on her
 amniocentesis, the option of pregnancy termination in light of
 all of the fetal anomalies noted today was discussed.  She
 will consider termination of pregnancy should a chromosomal
 abnormality be noted.  We will refer her to the appropriate
 facility for the termination procedure should she want to
 terminate her pregnancy.
 We will contact the patient once the amniocentesis results
 are available in a few days.
 The patient stated that all of her questions had been
 answered.
 A total of 30 minutes was spent counseling and coordinating
 the care for this patient.  Greater than 50% of the time was
 spent in direct face-to-face contact.
----------------------------------------------------------------------
----------------------------------------------------------------------

*** End of Addendum ***\.br----------------------------------------------------------------------
----------------------------------------------------------------------

----------------------------------------------------------------------

                                                            [REDACTED]
                                                            Ave., [HOSPITAL]
----------------------------------------------------------------------

----------------------------------------------------------------------

----------------------------------------------------------------------
Indications

 Obesity complicating pregnancy, second
 trimester (BMI 41)
 Encounter for antenatal screening for
 malformations
 NIPS/Horizon drawn 09/01/21
 Abnormal fetal ultrasound
 Medical complication of pregnancy
 (Pseudotumor cerebri)
 Short interval between pregancies, 2nd
 trimester
 Herpes simplex virus (TESSEMA)
 Tobacco use complicating pregnancy,
 second trimester
 History of cesarean delivery, currently
 pregnant (x1)
 Poor obstetric history: Previous preterm
 delivery, antepartum (61w2d)
 20 weeks gestation of pregnancy
----------------------------------------------------------------------
Fetal Evaluation

 Num Of Fetuses:         1
 Fetal Heart Rate(bpm):  153
 Cardiac Activity:       Observed
 Presentation:           Cephalic
 Placenta:               Posterior
 P. Cord Insertion:      Not well visualized
 Amniotic Fluid
 AFI FV:      Within normal limits

                             Largest Pocket(cm)

----------------------------------------------------------------------
Biometry

 BPD:      44.6  mm     G. Age:  19w 3d          9  %    CI:        60.01   %    70 - 86
                                                         FL/HC:      17.3   %    15.9 -
 HC:      186.7  mm     G. Age:  21w 0d         54  %    HC/AC:      1.14        1.06 -
 AC:      163.2  mm     G. Age:  21w 3d         66  %    FL/BPD:     72.4   %
 FL:       32.3  mm     G. Age:  20w 0d         21  %    FL/AC:      19.8   %    20 - 24
 HUM:      30.9  mm     G. Age:  20w 2d         36  %
 CER:      19.8  mm     G. Age:  19w 1d         15  %
 LV:       14.2  mm
 CM:        4.9  mm

 Est. FW:     375  gm    0 lb 13 oz      47  %
----------------------------------------------------------------------
OB History

 Gravidity:    2
 Living:       1
----------------------------------------------------------------------
Gestational Age

 LMP:           23w 0d        Date:  03/28/21                 EDD:   01/02/22
 U/S Today:     20w 3d                                        EDD:   01/20/22
 Best:          20w 5d     Det. By:  Early Ultrasound         EDD:   01/18/22
                                     (05/30/21)
----------------------------------------------------------------------
Anatomy

 Cranium:               Abnormal, see          Aortic Arch:            Abnormal, see
                        comments                                       comments
 Cavum:                 Appears normal         Ductal Arch:            Not well visualized
 Ventricles:            Ventriculomegaly,      Diaphragm:              Not well visualized
                        14.2mm
 Choroid Plexus:        Dangling Choroid       Stomach:                Appears normal, left
                                                                       sided
 Cerebellum:            Banana sign            Abdomen:                Not well visualized
 Posterior Fossa:       Abnormal, see          Abdominal Wall:         Appears nml (cord
                        comments
                                                                       insert, abd wall)
 Nuchal Fold:           Not applicable (>20    Cord Vessels:           2 Vessel Cord
                        wks GA)
 Face:                  Orbits nl; profile not Kidneys:                Abnormal, see
                        well visualized
                                                                       comments
 Lips:                  Appears normal         Bladder:                Appears normal
 Heart:                 VSD                    Spine:                  Abnormal, see
                                                                       comments
 RVOT:                  Not well visualized    Upper Extremities:      Clenched fingers
 LVOT:                  Not well visualized    Lower Extremities:      Abnormal, see
                                                                       comments

 Other:  Technically difficult due to maternal habitus.
----------------------------------------------------------------------
Guided Procedures
 Type:   Amniocentesis
 FH Pre Procedure:       153
 FH Post Procedure:      155
 Rh Type:                B+
 Rh Immunoglobulin:      Not required, Rh positive
 Discharge Instructions: Post-procedure instructions given
 Needle Insertions:      20 gauge x 1
 Vol. Withdrawn:         25 ml of clear amniotic fluid
 Transabdominal:         Yes
 Complications:  None
 Comment:        Informed consent was obtained. A "time-out" was
                 performed before the procedure. Patient tolerated the
                 procedure well. We gave her post-procedure instructions.
----------------------------------------------------------------------
Cervix Uterus Adnexa

 Cervix
 Length:           3.67  cm.
 Not visualized (advanced GA >36wks)

 Uterus
 No abnormality visualized.

 Right Ovary
 Within normal limits.

 Left Ovary
 Within normal limits.

 Cul De Sac
 No free fluid seen.

 Adnexa
 No adnexal mass visualized.
----------------------------------------------------------------------
Comments

 Eitan Almazan was seen for a detailed fetal anatomy scan
 due to maternal obesity and as multiple fetal anomalies were
 noted on a recent ultrasound performed in your office.
 She denies any significant past medical history and denies
 any problems in her current pregnancy.
 The patient just had a cell free DNA test drawn a few days
 ago after she had the abnormal ultrasound.  The results of
 that test are currently pending.
 She was informed that the fetal growth and amniotic fluid
 level were appropriate for her gestational age.
 On today's exam, an abnormally shaped head (Chai sign)
 with bilateral ventriculomegaly and a possible banana sign
 were noted in the fetal head.  The patient was advised that
 these findings may be consistent with spina bifida.  Multiple
 other fetal anomalies including clenched fists, a single
 umbilical artery, a possible missing left kidney, right
 pyelectasis, an abnormally shaped spine, and Oom
 feet were also noted.
 The patient was advised that the multiple anomalies noted
 today may be a sign of trisomy 18.  Due to my concerns
 regarding a fetal chromosomal abnormality due to today's
 ultrasound findings, I recommended that the patient undergo
 an amniocentesis for definitive diagnosis of fetal
 chromosomal abnormalities.
 After all risks versus benefits of the amniocentesis procedure
 were discussed, the patient agreed to undergo an
 amniocentesis today.
 After informed consent was obtained, a timeout was
 performed verifying the patient's identity and the indications
 for the procedure.  The patient was then prepped and draped
 in the usual sterile fashion.
 An uncomplicated genetic amniocentesis was performed
 today obtaining 25 cc of straw-colored amniotic fluid which
 was sent for amniotic fluid AFP, amniotic fluid
 acetylcholinesterase, FISH, and chromosome analysis with
 reflex MicroArray.  The patient was advised that our genetic
 counselor will notify her regarding the results of the
 amniocentesis.  Post amniocentesis instructions were
 discussed.  As the patient's blood type is Rh positive, a dose
 of RhoGam was not given following the procedure.
 The patient was advised that we will await the amniocentesis
 results before recommending any further management
 recommendations.  Should spina bifida be confirmed and the
 fetal chromosomes are within normal limits, we will consider a
 fetal MRI for further fetal assessment and will consider
 referral to [[facility] to discuss possible in utero
 surgical repair of spina bifida.
 However should a chromosomal abnormality be noted on her
 amniocentesis, the option of pregnancy termination in light of
 all of the fetal anomalies noted today was discussed.  She
 will consider termination of pregnancy should a chromosomal
 abnormality be noted.  We will refer her to the appropriate
 facility for the termination procedure should she want to
 terminate her pregnancy.
 We will contact the patient once the amniocentesis results
 are available in a few days.
 The patient stated that all of her questions had been
 answered.
 A total of 30 minutes was spent counseling and coordinating
 the care for this patient.  Greater than 50% of the time was
 spent in direct face-to-face contact.
----------------------------------------------------------------------
----------------------------------------------------------------------

## 2022-12-26 LAB — OB RESULTS CONSOLE RUBELLA ANTIBODY, IGM: Rubella: IMMUNE

## 2022-12-26 LAB — OB RESULTS CONSOLE ANTIBODY SCREEN: Antibody Screen: NEGATIVE

## 2022-12-26 LAB — OB RESULTS CONSOLE ABO/RH: RH Type: POSITIVE

## 2022-12-26 LAB — OB RESULTS CONSOLE HIV ANTIBODY (ROUTINE TESTING): HIV: NONREACTIVE

## 2022-12-26 LAB — OB RESULTS CONSOLE HEPATITIS B SURFACE ANTIGEN: Hepatitis B Surface Ag: NEGATIVE

## 2022-12-26 LAB — HEPATITIS C ANTIBODY: HCV Ab: NEGATIVE

## 2023-01-17 LAB — OB RESULTS CONSOLE GC/CHLAMYDIA
Chlamydia: NEGATIVE
Neisseria Gonorrhea: NEGATIVE

## 2023-02-22 ENCOUNTER — Other Ambulatory Visit: Payer: Self-pay | Admitting: Obstetrics and Gynecology

## 2023-02-22 DIAGNOSIS — Z363 Encounter for antenatal screening for malformations: Secondary | ICD-10-CM

## 2023-02-22 DIAGNOSIS — Z8279 Family history of other congenital malformations, deformations and chromosomal abnormalities: Secondary | ICD-10-CM

## 2023-03-27 ENCOUNTER — Ambulatory Visit: Payer: Medicaid Other

## 2023-04-03 ENCOUNTER — Encounter: Payer: Self-pay | Admitting: *Deleted

## 2023-04-03 DIAGNOSIS — O09299 Supervision of pregnancy with other poor reproductive or obstetric history, unspecified trimester: Secondary | ICD-10-CM | POA: Insufficient documentation

## 2023-04-03 DIAGNOSIS — O99330 Smoking (tobacco) complicating pregnancy, unspecified trimester: Secondary | ICD-10-CM | POA: Insufficient documentation

## 2023-04-03 DIAGNOSIS — O34219 Maternal care for unspecified type scar from previous cesarean delivery: Secondary | ICD-10-CM | POA: Insufficient documentation

## 2023-04-03 DIAGNOSIS — O99210 Obesity complicating pregnancy, unspecified trimester: Secondary | ICD-10-CM | POA: Insufficient documentation

## 2023-04-04 ENCOUNTER — Other Ambulatory Visit: Payer: Self-pay | Admitting: *Deleted

## 2023-04-04 ENCOUNTER — Ambulatory Visit: Payer: Medicaid Other | Attending: Obstetrics and Gynecology | Admitting: *Deleted

## 2023-04-04 ENCOUNTER — Ambulatory Visit (HOSPITAL_BASED_OUTPATIENT_CLINIC_OR_DEPARTMENT_OTHER): Payer: Medicaid Other

## 2023-04-04 VITALS — BP 128/70 | HR 85

## 2023-04-04 DIAGNOSIS — O321XX0 Maternal care for breech presentation, not applicable or unspecified: Secondary | ICD-10-CM | POA: Diagnosis not present

## 2023-04-04 DIAGNOSIS — Z3A36 36 weeks gestation of pregnancy: Secondary | ICD-10-CM | POA: Insufficient documentation

## 2023-04-04 DIAGNOSIS — Z363 Encounter for antenatal screening for malformations: Secondary | ICD-10-CM | POA: Diagnosis not present

## 2023-04-04 DIAGNOSIS — O09292 Supervision of pregnancy with other poor reproductive or obstetric history, second trimester: Secondary | ICD-10-CM

## 2023-04-04 DIAGNOSIS — Z8279 Family history of other congenital malformations, deformations and chromosomal abnormalities: Secondary | ICD-10-CM

## 2023-04-04 DIAGNOSIS — O24419 Gestational diabetes mellitus in pregnancy, unspecified control: Secondary | ICD-10-CM

## 2023-04-04 DIAGNOSIS — O99212 Obesity complicating pregnancy, second trimester: Secondary | ICD-10-CM

## 2023-04-04 DIAGNOSIS — O99213 Obesity complicating pregnancy, third trimester: Secondary | ICD-10-CM | POA: Insufficient documentation

## 2023-04-04 DIAGNOSIS — O99330 Smoking (tobacco) complicating pregnancy, unspecified trimester: Secondary | ICD-10-CM

## 2023-04-04 DIAGNOSIS — O09293 Supervision of pregnancy with other poor reproductive or obstetric history, third trimester: Secondary | ICD-10-CM | POA: Insufficient documentation

## 2023-04-04 DIAGNOSIS — O34219 Maternal care for unspecified type scar from previous cesarean delivery: Secondary | ICD-10-CM | POA: Diagnosis not present

## 2023-04-04 DIAGNOSIS — O98513 Other viral diseases complicating pregnancy, third trimester: Secondary | ICD-10-CM | POA: Insufficient documentation

## 2023-04-04 DIAGNOSIS — Z362 Encounter for other antenatal screening follow-up: Secondary | ICD-10-CM

## 2023-04-04 DIAGNOSIS — O09299 Supervision of pregnancy with other poor reproductive or obstetric history, unspecified trimester: Secondary | ICD-10-CM

## 2023-04-04 DIAGNOSIS — O99210 Obesity complicating pregnancy, unspecified trimester: Secondary | ICD-10-CM

## 2023-04-10 ENCOUNTER — Encounter: Payer: Self-pay | Admitting: Obstetrics and Gynecology

## 2023-04-10 ENCOUNTER — Encounter (HOSPITAL_COMMUNITY): Payer: Self-pay | Admitting: Obstetrics and Gynecology

## 2023-04-10 ENCOUNTER — Inpatient Hospital Stay (HOSPITAL_COMMUNITY)
Admission: AD | Admit: 2023-04-10 | Discharge: 2023-04-10 | Disposition: A | Discharge status: Home / Self Care | Payer: Medicaid Other | Attending: Obstetrics and Gynecology | Admitting: Obstetrics and Gynecology

## 2023-04-10 DIAGNOSIS — O26892 Other specified pregnancy related conditions, second trimester: Secondary | ICD-10-CM | POA: Diagnosis not present

## 2023-04-10 DIAGNOSIS — Z3A22 22 weeks gestation of pregnancy: Secondary | ICD-10-CM | POA: Insufficient documentation

## 2023-04-10 DIAGNOSIS — R519 Headache, unspecified: Secondary | ICD-10-CM | POA: Insufficient documentation

## 2023-04-10 DIAGNOSIS — O99332 Smoking (tobacco) complicating pregnancy, second trimester: Secondary | ICD-10-CM | POA: Insufficient documentation

## 2023-04-10 DIAGNOSIS — R0789 Other chest pain: Secondary | ICD-10-CM | POA: Diagnosis present

## 2023-04-10 HISTORY — DX: Panic disorder (episodic paroxysmal anxiety): F41.0

## 2023-04-10 HISTORY — DX: Syncope and collapse: R55

## 2023-04-10 HISTORY — DX: Herpesviral infection, unspecified: B00.9

## 2023-04-10 LAB — COMPREHENSIVE METABOLIC PANEL
ALT: 12 U/L (ref 0–44)
AST: 13 U/L — ABNORMAL LOW (ref 15–41)
Albumin: 3.2 g/dL — ABNORMAL LOW (ref 3.5–5.0)
Alkaline Phosphatase: 57 U/L (ref 38–126)
Anion gap: 11 (ref 5–15)
BUN: 9 mg/dL (ref 6–20)
CO2: 21 mmol/L — ABNORMAL LOW (ref 22–32)
Calcium: 9 mg/dL (ref 8.9–10.3)
Chloride: 103 mmol/L (ref 98–111)
Creatinine, Ser: 0.51 mg/dL (ref 0.44–1.00)
GFR, Estimated: >60 mL/min (ref >60)
Glucose, Bld: 90 mg/dL (ref 70–99)
Potassium: 4 mmol/L (ref 3.5–5.1)
Sodium: 135 mmol/L (ref 135–145)
Total Bilirubin: 0.2 mg/dL — ABNORMAL LOW (ref 0.3–1.2)
Total Protein: 6.3 g/dL — ABNORMAL LOW (ref 6.5–8.1)

## 2023-04-10 LAB — CBC
HCT: 37 % (ref 36.0–46.0)
Hemoglobin: 12.8 g/dL (ref 12.0–15.0)
MCH: 32.2 pg (ref 26.0–34.0)
MCHC: 34.6 g/dL (ref 30.0–36.0)
MCV: 93.2 fL (ref 80.0–100.0)
Platelets: 202 10*3/uL (ref 150–400)
RBC: 3.97 MIL/uL (ref 3.87–5.11)
RDW: 14.2 % (ref 11.5–15.5)
WBC: 8.8 10*3/uL (ref 4.0–10.5)
nRBC: 0 % (ref 0.0–0.2)

## 2023-04-10 LAB — URINALYSIS, ROUTINE W REFLEX MICROSCOPIC
Bilirubin Urine: NEGATIVE
Glucose, UA: NEGATIVE mg/dL
Hgb urine dipstick: NEGATIVE
Ketones, ur: NEGATIVE mg/dL
Leukocytes,Ua: NEGATIVE
Nitrite: NEGATIVE
Protein, ur: NEGATIVE mg/dL
Specific Gravity, Urine: 1.026 (ref 1.005–1.030)
pH: 6 (ref 5.0–8.0)

## 2023-04-10 LAB — PROTEIN / CREATININE RATIO, URINE
Creatinine, Urine: 132 mg/dL
Protein Creatinine Ratio: 0.08 mg/mg{Cre} (ref 0.00–0.15)
Total Protein, Urine: 10 mg/dL

## 2023-04-10 LAB — TROPONIN I (HIGH SENSITIVITY): Troponin I (High Sensitivity): 8 ng/L (ref <18)

## 2023-04-10 NOTE — MAU Provider Note (Signed)
History     CSN: [id number]  Arrival date and time: 04/10/23 1533   Event Date/Time   First Provider Initiated Contact with Patient 04/10/23 1619      Chief Complaint  Patient presents with   Chest Pain   Headache   HPI [name] is a 32 y.o. year old [id number] female at [redacted]w[redacted]d weeks gestation who presents to MAU reporting before she went to bed last night her LT arm went numb, her whole face started tingling and a slight H/A. She went to sleep without any symptoms. She went to work and started having "sharp" chest pains and a slight H/A at 1130 this morning. She says the pain makes it difficult for her to take a deep breath. She states, "I feel like I have to yawn in order to get a good deep breath." One of her co-workers took her BP and it was 154/89. She called her OB's office and was told to come here. She rates the H/A a 4/10 and the chest pain a 6/10. She states, "the H/A don't concern me as much as the chest pain." She receives [facility] with [facility]; next appt is 04/17/2023.  OB History     Gravida  3   Para  2   Term      Preterm  2   AB      Living  1      SAB      IAB      Ectopic      Multiple  0   Live Births  1           Past Medical History:  Diagnosis Date   Anxiety    Depression    Headache    Hidradenitis    HSV infection    Panic attacks    Pseudotumor cerebri    Syncope    Trimalleolar fracture of ankle, closed 06/10/2017   right    Past Surgical History:  Procedure Laterality Date   CESAREAN SECTION N/A 11/09/2020   Procedure: CESAREAN SECTION;  Surgeon: Steva Ready, DO;  Location: [facility];  Service: Obstetrics;  Laterality: N/A;   LUMBAR PUNCTURE  09/09/2002; 10/14/2002; 05/11/2004, 02/2021   under anesthesia   ORIF ANKLE FRACTURE Right 06/15/2017   Procedure: OPEN REDUCTION INTERNAL FIXATION (ORIF)RIGHT TRIMALLEOLAR ANKLE FRACTURE WITHOUT POSTERIOR FIXATION;  Surgeon: Sheral Apley, MD;  Location: [facility];  Service: Orthopedics;  Laterality: Right;    Family History  Problem Relation Age of Onset   Diabetes Mother    Diabetes Other    Heart failure Other    Breast cancer Other     Social History   Tobacco Use   Smoking status: Every Day    Packs/day: 1.00    Years: 7.00    Additional pack years: 0.00    Total pack years: 7.00    Types: Cigarettes   Smokeless tobacco: Never  Vaping Use   Vaping Use: Never used  Substance Use Topics   Alcohol use: Not Currently    Comment: weekends   Drug use: Not Currently    Types: Marijuana    Allergies:  Allergies  Allergen Reactions   Amoxicillin Rash   Bactrim [Sulfamethoxazole-Trimethoprim] Rash    No medications prior to admission.    Review of Systems  Constitutional: Negative.   HENT: Negative.    Eyes: Negative.   Respiratory: Negative.    Cardiovascular:  Positive for chest pain ("sharp" in  the middle of chest).  Gastrointestinal: Negative.   Endocrine: Negative.   Genitourinary: Negative.   Musculoskeletal: Negative.   Skin: Negative.   Allergic/Immunologic: Negative.   Neurological:  Positive for headaches ("slight").  Hematological: Negative.   Psychiatric/Behavioral: Negative.     Physical Exam  Patient Vitals for the past 24 hrs:  BP Temp Pulse Resp SpO2 Height Weight  04/10/23 1800 115/73 -- 79 -- 98 % -- --  04/10/23 1746 115/63 -- 72 -- -- -- --  04/10/23 1731 (!) 116/59 -- 75 -- -- -- --  04/10/23 1715 116/66 -- 78 -- 98 % -- --  04/10/23 1700 122/67 -- 81 -- 97 % -- --  04/10/23 1645 116/66 -- 82 -- 97 % -- --  04/10/23 1630 118/73 -- 79 -- 99 % -- --  04/10/23 1616 128/70 -- 83 -- -- -- --  04/10/23 1547 132/69 98.8 F (37.1 C) 84 18 -- 5\' 4"  (1.626 m) 109.3 kg   Physical Exam Vitals and nursing note reviewed.  Constitutional:      Appearance: Normal appearance. She is obese.  HENT:     Head: Normocephalic and atraumatic.  Eyes:     Extraocular Movements: Extraocular  movements intact.     Conjunctiva/sclera: Conjunctivae normal.     Pupils: Pupils are equal, round, and reactive to light.  Cardiovascular:     Rate and Rhythm: Normal rate.  Pulmonary:     Effort: Pulmonary effort is normal.  Genitourinary:    Comments: Not indicated Musculoskeletal:        General: Normal range of motion.  Skin:    General: Skin is warm and dry.  Neurological:     Mental Status: She is alert and oriented to person, place, and time.  Psychiatric:        Mood and Affect: Mood normal.        Behavior: Behavior normal.        Thought Content: Thought content normal.        Judgment: Judgment normal.    FHTs by doppler: 154 bpm  MAU Course  Procedures  MDM CCUA CBC CMP P/C Ratio Serial BP's   Results for orders placed or performed during the hospital encounter of 04/10/23 (from the past 24 hour(s))  Urinalysis, Routine w reflex microscopic -Urine, Clean Catch     Status: Abnormal   Collection Time: 04/10/23  4:09 PM  Result Value Ref Range   Color, Urine YELLOW YELLOW   APPearance HAZY (A) CLEAR   Specific Gravity, Urine 1.026 1.005 - 1.030   pH 6.0 5.0 - 8.0   Glucose, UA NEGATIVE NEGATIVE mg/dL   Hgb urine dipstick NEGATIVE NEGATIVE   Bilirubin Urine NEGATIVE NEGATIVE   Ketones, ur NEGATIVE NEGATIVE mg/dL   Protein, ur NEGATIVE NEGATIVE mg/dL   Nitrite NEGATIVE NEGATIVE   Leukocytes,Ua NEGATIVE NEGATIVE  Protein / creatinine ratio, urine     Status: None   Collection Time: 04/10/23  4:09 PM  Result Value Ref Range   Creatinine, Urine 132 mg/dL   Total Protein, Urine 10 mg/dL   Protein Creatinine Ratio 0.08 0.00 - 0.15 mg/mg[Cre]  CBC     Status: None   Collection Time: 04/10/23  4:38 PM  Result Value Ref Range   WBC 8.8 4.0 - 10.5 K/uL   RBC 3.97 3.87 - 5.11 MIL/uL   Hemoglobin 12.8 12.0 - 15.0 g/dL   HCT 16.1 09.6 - 04.5 %   MCV 93.2 80.0 - 100.0 fL  MCH 32.2 26.0 - 34.0 pg   MCHC 34.6 30.0 - 36.0 g/dL   RDW 16.1 09.6 - 04.5 %    Platelets 202 150 - 400 K/uL   nRBC 0.0 0.0 - 0.2 %  Comprehensive metabolic panel     Status: Abnormal   Collection Time: 04/10/23  4:38 PM  Result Value Ref Range   Sodium 135 135 - 145 mmol/L   Potassium 4.0 3.5 - 5.1 mmol/L   Chloride 103 98 - 111 mmol/L   CO2 21 (L) 22 - 32 mmol/L   Glucose, Bld 90 70 - 99 mg/dL   BUN 9 6 - 20 mg/dL   Creatinine, Ser 4.09 0.44 - 1.00 mg/dL   Calcium 9.0 8.9 - 81.1 mg/dL   Total Protein 6.3 (L) 6.5 - 8.1 g/dL   Albumin 3.2 (L) 3.5 - 5.0 g/dL   AST 13 (L) 15 - 41 U/L   ALT 12 0 - 44 U/L   Alkaline Phosphatase 57 38 - 126 U/L   Total Bilirubin 0.2 (L) 0.3 - 1.2 mg/dL   GFR, Estimated >91 >47 mL/min   Anion gap 11 5 - 15  Troponin I (High Sensitivity)     Status: None   Collection Time: 04/10/23  4:38 PM  Result Value Ref Range   Troponin I (High Sensitivity) 8 <18 ng/L      Assessment and Plan  1. Non-cardiac chest pain - Reassurance given that EKG was normal and the likelihood that cardiac involvement is very low  2. Headache in pregnancy, antepartum, second trimester - Advised to purchase Excedrin Tension Headache OTC (picture given) and take as directed on the bottle  3. [redacted] weeks gestation of pregnancy   - Discharge patient - Keep scheduled appt with [facility] on 04/17/2023 - Patient verbalized an understanding of the plan of care and agrees.    Raelyn Mora, CNM 04/10/2023, 4:19 PM

## 2023-04-10 NOTE — MAU Note (Signed)
.  [name] is a 32 y.o. at [redacted]w[redacted]d here in MAU reporting: last night before she went to bed her left arm was numb and tingling in her face (whole face) and had a slight headache. Went to sleep and arm numbness and face tingling was not present. Went to work and then she said her chest stare hurting and she still has a slight headache. Stated he b/p was higher than it normally 154/89. Good fetal movement reported.   Onset of complaint: last night Pain score: 4-6 Vitals:   04/10/23 1547  BP: 132/69  Pulse: 84  Resp: 18  Temp: 98.8 F (37.1 C)     FHT:154  Lab orders placed from triage:  u/a

## 2023-04-10 NOTE — Discharge Instructions (Signed)
  Purchase this over-the-counter for headaches. Take it as directed on the bottle. It is safe in pregnancy.

## 2023-05-08 ENCOUNTER — Ambulatory Visit: Payer: Medicaid Other

## 2023-05-10 LAB — OB RESULTS CONSOLE RPR: RPR: NONREACTIVE

## 2023-05-14 ENCOUNTER — Ambulatory Visit: Payer: Medicaid Other | Attending: Maternal & Fetal Medicine | Admitting: *Deleted

## 2023-05-14 ENCOUNTER — Other Ambulatory Visit: Payer: Self-pay | Admitting: *Deleted

## 2023-05-14 ENCOUNTER — Ambulatory Visit (HOSPITAL_BASED_OUTPATIENT_CLINIC_OR_DEPARTMENT_OTHER): Payer: Medicaid Other

## 2023-05-14 VITALS — BP 124/65 | HR 77

## 2023-05-14 DIAGNOSIS — B009 Herpesviral infection, unspecified: Secondary | ICD-10-CM | POA: Diagnosis not present

## 2023-05-14 DIAGNOSIS — O24419 Gestational diabetes mellitus in pregnancy, unspecified control: Secondary | ICD-10-CM

## 2023-05-14 DIAGNOSIS — E669 Obesity, unspecified: Secondary | ICD-10-CM

## 2023-05-14 DIAGNOSIS — O09292 Supervision of pregnancy with other poor reproductive or obstetric history, second trimester: Secondary | ICD-10-CM | POA: Diagnosis not present

## 2023-05-14 DIAGNOSIS — O98513 Other viral diseases complicating pregnancy, third trimester: Secondary | ICD-10-CM | POA: Insufficient documentation

## 2023-05-14 DIAGNOSIS — O24415 Gestational diabetes mellitus in pregnancy, controlled by oral hypoglycemic drugs: Secondary | ICD-10-CM | POA: Diagnosis not present

## 2023-05-14 DIAGNOSIS — Z8279 Family history of other congenital malformations, deformations and chromosomal abnormalities: Secondary | ICD-10-CM

## 2023-05-14 DIAGNOSIS — O98812 Other maternal infectious and parasitic diseases complicating pregnancy, second trimester: Secondary | ICD-10-CM | POA: Diagnosis not present

## 2023-05-14 DIAGNOSIS — O09293 Supervision of pregnancy with other poor reproductive or obstetric history, third trimester: Secondary | ICD-10-CM | POA: Diagnosis not present

## 2023-05-14 DIAGNOSIS — O99213 Obesity complicating pregnancy, third trimester: Secondary | ICD-10-CM | POA: Insufficient documentation

## 2023-05-14 DIAGNOSIS — Z3A27 27 weeks gestation of pregnancy: Secondary | ICD-10-CM | POA: Insufficient documentation

## 2023-05-14 DIAGNOSIS — O34219 Maternal care for unspecified type scar from previous cesarean delivery: Secondary | ICD-10-CM

## 2023-05-14 DIAGNOSIS — Z362 Encounter for other antenatal screening follow-up: Secondary | ICD-10-CM

## 2023-05-14 DIAGNOSIS — O99212 Obesity complicating pregnancy, second trimester: Secondary | ICD-10-CM

## 2023-05-14 DIAGNOSIS — O09299 Supervision of pregnancy with other poor reproductive or obstetric history, unspecified trimester: Secondary | ICD-10-CM

## 2023-05-14 DIAGNOSIS — O99332 Smoking (tobacco) complicating pregnancy, second trimester: Secondary | ICD-10-CM

## 2023-05-14 DIAGNOSIS — Z7984 Long term (current) use of oral hypoglycemic drugs: Secondary | ICD-10-CM | POA: Diagnosis not present

## 2023-05-14 DIAGNOSIS — O09212 Supervision of pregnancy with history of pre-term labor, second trimester: Secondary | ICD-10-CM

## 2023-06-18 ENCOUNTER — Ambulatory Visit: Payer: Medicaid Other

## 2023-06-19 ENCOUNTER — Ambulatory Visit: Payer: Medicaid Other | Attending: Maternal & Fetal Medicine

## 2023-06-19 ENCOUNTER — Other Ambulatory Visit: Payer: Self-pay | Admitting: *Deleted

## 2023-06-19 ENCOUNTER — Ambulatory Visit: Payer: Medicaid Other | Admitting: *Deleted

## 2023-06-19 VITALS — BP 117/75 | HR 94

## 2023-06-19 DIAGNOSIS — O99213 Obesity complicating pregnancy, third trimester: Secondary | ICD-10-CM

## 2023-06-19 DIAGNOSIS — Z8279 Family history of other congenital malformations, deformations and chromosomal abnormalities: Secondary | ICD-10-CM | POA: Insufficient documentation

## 2023-06-19 DIAGNOSIS — O99212 Obesity complicating pregnancy, second trimester: Secondary | ICD-10-CM | POA: Diagnosis present

## 2023-06-19 DIAGNOSIS — E669 Obesity, unspecified: Secondary | ICD-10-CM

## 2023-06-19 DIAGNOSIS — O24415 Gestational diabetes mellitus in pregnancy, controlled by oral hypoglycemic drugs: Secondary | ICD-10-CM

## 2023-06-19 DIAGNOSIS — O98513 Other viral diseases complicating pregnancy, third trimester: Secondary | ICD-10-CM

## 2023-06-19 DIAGNOSIS — O09293 Supervision of pregnancy with other poor reproductive or obstetric history, third trimester: Secondary | ICD-10-CM | POA: Diagnosis not present

## 2023-06-19 DIAGNOSIS — O09213 Supervision of pregnancy with history of pre-term labor, third trimester: Secondary | ICD-10-CM | POA: Diagnosis not present

## 2023-06-19 DIAGNOSIS — O24419 Gestational diabetes mellitus in pregnancy, unspecified control: Secondary | ICD-10-CM

## 2023-06-19 DIAGNOSIS — O34219 Maternal care for unspecified type scar from previous cesarean delivery: Secondary | ICD-10-CM

## 2023-06-19 DIAGNOSIS — O99332 Smoking (tobacco) complicating pregnancy, second trimester: Secondary | ICD-10-CM | POA: Insufficient documentation

## 2023-06-19 DIAGNOSIS — B009 Herpesviral infection, unspecified: Secondary | ICD-10-CM

## 2023-06-19 DIAGNOSIS — Z3A32 32 weeks gestation of pregnancy: Secondary | ICD-10-CM

## 2023-06-25 ENCOUNTER — Ambulatory Visit: Payer: Medicaid Other | Attending: Maternal & Fetal Medicine | Admitting: *Deleted

## 2023-06-25 ENCOUNTER — Ambulatory Visit: Payer: Medicaid Other

## 2023-06-25 VITALS — BP 125/75 | HR 80

## 2023-06-25 DIAGNOSIS — Z3A33 33 weeks gestation of pregnancy: Secondary | ICD-10-CM | POA: Insufficient documentation

## 2023-06-25 DIAGNOSIS — O24415 Gestational diabetes mellitus in pregnancy, controlled by oral hypoglycemic drugs: Secondary | ICD-10-CM | POA: Diagnosis present

## 2023-06-25 NOTE — Procedures (Signed)
[name] 02/21/1991 [redacted]w[redacted]d  Fetus A Non-Stress Test Interpretation for 06/25/23  Indication: Gestational Diabetes medication controlled and Obesity  Fetal Heart Rate A Mode: External Baseline Rate (A): 140 bpm Variability: Moderate Accelerations: 15 x 15 Decelerations: None Multiple birth?: No  Uterine Activity Mode: Palpation, Toco Contraction Frequency (min): None  Interpretation (Fetal Testing) Nonstress Test Interpretation: Reactive Comments: Dr. Parke Poisson reviewed tracing.

## 2023-07-02 ENCOUNTER — Ambulatory Visit: Payer: Medicaid Other

## 2023-07-04 ENCOUNTER — Ambulatory Visit: Payer: Medicaid Other | Attending: Maternal & Fetal Medicine | Admitting: *Deleted

## 2023-07-04 ENCOUNTER — Ambulatory Visit: Payer: Medicaid Other

## 2023-07-04 VITALS — BP 134/82 | HR 77

## 2023-07-04 DIAGNOSIS — E669 Obesity, unspecified: Secondary | ICD-10-CM | POA: Insufficient documentation

## 2023-07-04 DIAGNOSIS — O24419 Gestational diabetes mellitus in pregnancy, unspecified control: Secondary | ICD-10-CM | POA: Insufficient documentation

## 2023-07-04 DIAGNOSIS — O24415 Gestational diabetes mellitus in pregnancy, controlled by oral hypoglycemic drugs: Secondary | ICD-10-CM | POA: Diagnosis not present

## 2023-07-04 DIAGNOSIS — Z3A34 34 weeks gestation of pregnancy: Secondary | ICD-10-CM | POA: Insufficient documentation

## 2023-07-04 DIAGNOSIS — O99213 Obesity complicating pregnancy, third trimester: Secondary | ICD-10-CM | POA: Diagnosis not present

## 2023-07-04 NOTE — Procedures (Signed)
Dianelys Gengler Jun 03, 1991 [redacted]w[redacted]d  Fetus A Non-Stress Test Interpretation for 07/04/23-NST only  Indication: Gestational Diabetes medication controlled and obese  Fetal Heart Rate A Mode: External Baseline Rate (A): 140 bpm Variability: Moderate Accelerations: 15 x 15 Decelerations: None Multiple birth?: No  Uterine Activity Mode: Toco Contraction Frequency (min): occas Contraction Duration (sec): 4-8 Resting Tone Palpated: Relaxed Resting Time: Adequate  Interpretation (Fetal Testing) Nonstress Test Interpretation: Reactive Comments: Traicng reviewed byDr. Judeth Cornfield

## 2023-07-06 ENCOUNTER — Inpatient Hospital Stay (HOSPITAL_COMMUNITY)
Admission: AD | Admit: 2023-07-06 | Discharge: 2023-07-06 | Disposition: A | Discharge status: Home / Self Care | Payer: Medicaid Other | Attending: Obstetrics and Gynecology | Admitting: Obstetrics and Gynecology

## 2023-07-06 ENCOUNTER — Encounter (HOSPITAL_COMMUNITY): Payer: Self-pay | Admitting: Obstetrics and Gynecology

## 2023-07-06 DIAGNOSIS — O4700 False labor before 37 completed weeks of gestation, unspecified trimester: Secondary | ICD-10-CM | POA: Diagnosis not present

## 2023-07-06 DIAGNOSIS — Z3A34 34 weeks gestation of pregnancy: Secondary | ICD-10-CM | POA: Diagnosis not present

## 2023-07-06 DIAGNOSIS — Z8669 Personal history of other diseases of the nervous system and sense organs: Secondary | ICD-10-CM | POA: Insufficient documentation

## 2023-07-06 DIAGNOSIS — O1203 Gestational edema, third trimester: Secondary | ICD-10-CM | POA: Diagnosis not present

## 2023-07-06 DIAGNOSIS — O133 Gestational [pregnancy-induced] hypertension without significant proteinuria, third trimester: Secondary | ICD-10-CM | POA: Diagnosis not present

## 2023-07-06 DIAGNOSIS — O26893 Other specified pregnancy related conditions, third trimester: Secondary | ICD-10-CM | POA: Diagnosis present

## 2023-07-06 DIAGNOSIS — R519 Headache, unspecified: Secondary | ICD-10-CM | POA: Diagnosis present

## 2023-07-06 LAB — COMPREHENSIVE METABOLIC PANEL
ALT: 9 U/L (ref 0–44)
AST: 14 U/L — ABNORMAL LOW (ref 15–41)
Albumin: 2.7 g/dL — ABNORMAL LOW (ref 3.5–5.0)
Alkaline Phosphatase: 111 U/L (ref 38–126)
Anion gap: 10 (ref 5–15)
BUN: 6 mg/dL (ref 6–20)
CO2: 20 mmol/L — ABNORMAL LOW (ref 22–32)
Calcium: 8.4 mg/dL — ABNORMAL LOW (ref 8.9–10.3)
Chloride: 103 mmol/L (ref 98–111)
Creatinine, Ser: 0.53 mg/dL (ref 0.44–1.00)
GFR, Estimated: >60 mL/min (ref >60)
Glucose, Bld: 96 mg/dL (ref 70–99)
Potassium: 3.6 mmol/L (ref 3.5–5.1)
Sodium: 133 mmol/L — ABNORMAL LOW (ref 135–145)
Total Bilirubin: 0.5 mg/dL (ref 0.3–1.2)
Total Protein: 6.1 g/dL — ABNORMAL LOW (ref 6.5–8.1)

## 2023-07-06 LAB — URINALYSIS, ROUTINE W REFLEX MICROSCOPIC
Bacteria, UA: NONE SEEN
Bilirubin Urine: NEGATIVE
Glucose, UA: NEGATIVE mg/dL
Ketones, ur: NEGATIVE mg/dL
Nitrite: NEGATIVE
Protein, ur: NEGATIVE mg/dL
Specific Gravity, Urine: 1.016 (ref 1.005–1.030)
pH: 6 (ref 5.0–8.0)

## 2023-07-06 LAB — PROTEIN / CREATININE RATIO, URINE
Creatinine, Urine: 91 mg/dL
Protein Creatinine Ratio: 0.2 mg/mg{Cre} — ABNORMAL HIGH (ref 0.00–0.15)
Total Protein, Urine: 18 mg/dL

## 2023-07-06 LAB — CBC
HCT: 35 % — ABNORMAL LOW (ref 36.0–46.0)
Hemoglobin: 12.1 g/dL (ref 12.0–15.0)
MCH: 31.2 pg (ref 26.0–34.0)
MCHC: 34.6 g/dL (ref 30.0–36.0)
MCV: 90.2 fL (ref 80.0–100.0)
Platelets: 201 10*3/uL (ref 150–400)
RBC: 3.88 MIL/uL (ref 3.87–5.11)
RDW: 13.4 % (ref 11.5–15.5)
WBC: 10.7 10*3/uL — ABNORMAL HIGH (ref 4.0–10.5)
nRBC: 0 % (ref 0.0–0.2)

## 2023-07-06 MED ORDER — NIFEDIPINE 10 MG PO CAPS
10.0000 mg | ORAL_CAPSULE | ORAL | Status: DC | PRN
  Administered 2023-07-06 (×3): 10 mg via ORAL
  Filled 2023-07-06 (×3): qty 1

## 2023-07-06 NOTE — MAU Provider Note (Signed)
Chief Complaint:  Headache and Foot Swelling   Event Date/Time   First Provider Initiated Contact with Patient 07/06/23 0026     HPI: [name] is a 32 y.o. [id number] at 19w4dwho presents to maternity admissions reporting swelling over face and body, headache and intermittent cramping.  Has a history of pseudotumor cerebrii.  Marland KitchenHas some intermittent contractions, not very painful, mostly pressure She reports good fetal movement, denies LOF, vaginal bleeding, urinary symptoms, dizziness, n/v, or fever/chills.   Headache  This is a new problem. The current episode started today. The quality of the pain is described as aching and dull. Pertinent negatives include no back pain, blurred vision, fever or nausea. She has tried nothing for the symptoms.   RN Note: [name] is a 32 y.o. at [redacted]w[redacted]d here in MAU reporting swelling in face, hands and feet a few hours ago. Has had some swelling but just seems like it really worsened tonight. Has a headache that is sharp that comes and goes. Denies visual changes or epigastric pain. Occ abd cramping. 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 Has intermittent spinal taps to relieve the pressure.   Past Medical History: Past Medical History:  Diagnosis Date   Anxiety    Depression    Headache    Hidradenitis    HSV infection    Panic attacks    Pseudotumor cerebri    Syncope    Trimalleolar fracture of ankle, closed 06/10/2017   right    Past obstetric history: OB History  Gravida Para Term Preterm AB Living  4 2   2 1 1   SAB IAB Ectopic Multiple Live Births    1   0 1    # Outcome Date GA Lbr Len/2nd Weight Sex Type Anes PTL Lv  4 Current           3 Preterm 09/18/21 [redacted]w[redacted]d    TAB        Birth Comments: Trisomy 24, Spina Bifida  2 Preterm 11/09/20 [redacted]w[redacted]d  3031 g M CS-LTranv Spinal  LIV     Birth Comments: WNL  1 IAB             Past Surgical History: Past Surgical History:  Procedure Laterality Date   CESAREAN  SECTION N/A 11/09/2020   Procedure: CESAREAN SECTION;  Surgeon: Steva Ready, DO;  Location: [facility];  Service: Obstetrics;  Laterality: N/A;   LUMBAR PUNCTURE  09/09/2002; 10/14/2002; 05/11/2004, 02/2021   under anesthesia   ORIF ANKLE FRACTURE Right 06/15/2017   Procedure: OPEN REDUCTION INTERNAL FIXATION (ORIF)RIGHT TRIMALLEOLAR ANKLE FRACTURE WITHOUT POSTERIOR FIXATION;  Surgeon: Sheral Apley, MD;  Location: [facility];  Service: Orthopedics;  Laterality: Right;    Family History: Family History  Problem Relation Age of Onset   Diabetes Mother    Diabetes Other    Heart failure Other    Breast cancer Other     Social History: Social History   Tobacco Use   Smoking status: Every Day    Current packs/day: 1.00    Average packs/day: 1 pack/day for 7.0 years (7.0 ttl pk-yrs)    Types: Cigarettes   Smokeless tobacco: Never  Vaping Use   Vaping status: Never Used  Substance Use Topics   Alcohol use: Not Currently    Comment: weekends   Drug use: Not Currently    Types: Marijuana    Allergies:  Allergies  Allergen Reactions   Amoxicillin Rash  Bactrim [Sulfamethoxazole-Trimethoprim] Rash    Meds:  Medications Prior to Admission  Medication Sig Dispense Refill Last Dose   acetaminophen (TYLENOL) 325 MG tablet Take 2 tablets (650 mg total) by mouth every 6 (six) hours as needed for mild pain or moderate pain. 60 tablet 0    folic acid (FOLVITE) 800 MCG tablet Take 400 mcg by mouth daily.      metformin (FORTAMET) 500 MG (OSM) 24 hr tablet Take 500 mg by mouth daily with breakfast.      Prenatal Vit-Fe Fumarate-FA (PRENATAL VITAMIN PO) Take by mouth.       I have reviewed patient's Past Medical Hx, Surgical Hx, Family Hx, Social Hx, medications and allergies.   ROS:  Review of Systems  Constitutional:  Negative for fever.  Eyes:  Negative for blurred vision.  Gastrointestinal:  Negative for nausea.  Musculoskeletal:  Negative for back pain.   Neurological:  Positive for headaches.   Other systems negative  Physical Exam  Patient Vitals for the past 24 hrs:  BP Temp Pulse Resp SpO2 Height Weight  07/06/23 0014 139/87 -- -- -- -- -- --  07/06/23 0011 -- 98.8 F (37.1 C) 80 18 98 % 5\' 4"  (1.626 m) 120.2 kg   Constitutional: Well-developed, well-nourished female in no acute distress.  Cardiovascular: normal rate  Respiratory: normal effort GI: Abd soft, non-tender, gravid appropriate for gestational age.   No rebound or guarding. MS: Extremities nontender, no edema, normal ROM Neurologic: Alert and oriented x 4.  GU: Neg CVAT.  PELVIC EXAM: Dilation: Closed Effacement (%): Thick Cervical Position: Posterior Station: Ballotable Exam by:: Dosha Broshears CNM    FHT:  Baseline 140 , moderate variability, accelerations present, no decelerations Contractions: q 3 mins  Patient states she does not feel them much.   Labs: Results for orders placed or performed during the hospital encounter of 07/06/23 (from the past 24 hour(s))  Urinalysis, Routine w reflex microscopic -Urine, Clean Catch     Status: Abnormal   Collection Time: 07/06/23 12:25 AM  Result Value Ref Range   Color, Urine YELLOW YELLOW   APPearance HAZY (A) CLEAR   Specific Gravity, Urine 1.016 1.005 - 1.030   pH 6.0 5.0 - 8.0   Glucose, UA NEGATIVE NEGATIVE mg/dL   Hgb urine dipstick SMALL (A) NEGATIVE   Bilirubin Urine NEGATIVE NEGATIVE   Ketones, ur NEGATIVE NEGATIVE mg/dL   Protein, ur NEGATIVE NEGATIVE mg/dL   Nitrite NEGATIVE NEGATIVE   Leukocytes,Ua TRACE (A) NEGATIVE   RBC / HPF 0-5 0 - 5 RBC/hpf   WBC, UA 6-10 0 - 5 WBC/hpf   Bacteria, UA NONE SEEN NONE SEEN   Squamous Epithelial / HPF 0-5 0 - 5 /HPF   Mucus PRESENT   Protein / creatinine ratio, urine     Status: Abnormal   Collection Time: 07/06/23 12:25 AM  Result Value Ref Range   Creatinine, Urine 91 mg/dL   Total Protein, Urine 18 mg/dL   Protein Creatinine Ratio 0.20 (H) 0.00 - 0.15  mg/mg[Cre]  CBC     Status: Abnormal   Collection Time: 07/06/23 12:37 AM  Result Value Ref Range   WBC 10.7 (H) 4.0 - 10.5 K/uL   RBC 3.88 3.87 - 5.11 MIL/uL   Hemoglobin 12.1 12.0 - 15.0 g/dL   HCT 40.9 (L) 81.1 - 91.4 %   MCV 90.2 80.0 - 100.0 fL   MCH 31.2 26.0 - 34.0 pg   MCHC 34.6 30.0 - 36.0 g/dL  RDW 13.4 11.5 - 15.5 %   Platelets 201 150 - 400 K/uL   nRBC 0.0 0.0 - 0.2 %  Comprehensive metabolic panel     Status: Abnormal   Collection Time: 07/06/23 12:37 AM  Result Value Ref Range   Sodium 133 (L) 135 - 145 mmol/L   Potassium 3.6 3.5 - 5.1 mmol/L   Chloride 103 98 - 111 mmol/L   CO2 20 (L) 22 - 32 mmol/L   Glucose, Bld 96 70 - 99 mg/dL   BUN 6 6 - 20 mg/dL   Creatinine, Ser 7.82 0.44 - 1.00 mg/dL   Calcium 8.4 (L) 8.9 - 10.3 mg/dL   Total Protein 6.1 (L) 6.5 - 8.1 g/dL   Albumin 2.7 (L) 3.5 - 5.0 g/dL   AST 14 (L) 15 - 41 U/L   ALT 9 0 - 44 U/L   Alkaline Phosphatase 111 38 - 126 U/L   Total Bilirubin 0.5 0.3 - 1.2 mg/dL   GFR, Estimated >95 >62 mL/min   Anion gap 10 5 - 15       Imaging:    MAU Course/MDM: I have reviewed the triage vital signs and the nursing notes.   Pertinent labs & imaging results that were available during my care of the patient were reviewed by me and considered in my medical decision making (see chart for details).      I have reviewed her medical records including past results, notes and treatments.   I have ordered labs and reviewed results. Labs are normal  Intermittent elevations in blood pressure.  No headache or visual changes Discussed she has gestational hypertension Reviewed signs of preeclampsia to return for and report NST reviewed Procardia given for contractions, which did diminish with second dose.  Cervix long and closed Consult Dr Connye Burkitt with presentation, exam findings and test results. She will arrange followup Treatments in MAU included EFM, Procardia series..    Assessment: Single IUP at [redacted]w[redacted]d New  Gestational Hypertension Preterm contractions  Plan: Discharge home Preeclampsia precautions Labor precautions and fetal kick counts Follow up in Office for prenatal visits and recheck Encouraged to return if she develops worsening of symptoms, increase in pain, fever, or other concerning symptoms.   Pt stable at time of discharge.  Wynelle Bourgeois CNM, MSN Certified Nurse-Midwife 07/06/2023 12:26 AM

## 2023-07-06 NOTE — MAU Note (Signed)
OK to remove transducer per Artelia Laroche CNM

## 2023-07-06 NOTE — Progress Notes (Signed)
Written and verbal d/c instructions given and understanding voiced. 

## 2023-07-06 NOTE — MAU Note (Addendum)
.  [name] is a 32 y.o. at [redacted]w[redacted]d here in MAU reporting swelling in face, hands and feet a few hours ago. Has had some swelling but just seems like it really worsened tonight. Has a headache that is sharp that comes and goes. Denies visual changes or epigastric pain. Occ abd cramping. 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 Has intermittent spinal taps to relieve the pressure.   Onset of complaint: couple hours ago.  Pain score: 6  Vitals:   07/06/23 0011 07/06/23 0014  BP:  139/87  Pulse: 80   Resp: 18   Temp: 98.8 F (37.1 C)   SpO2: 98%      FHT:144 Lab orders placed from triage:  u/a

## 2023-07-09 ENCOUNTER — Ambulatory Visit: Payer: Medicaid Other | Attending: Obstetrics | Admitting: *Deleted

## 2023-07-09 ENCOUNTER — Ambulatory Visit: Payer: Medicaid Other | Admitting: *Deleted

## 2023-07-09 VITALS — BP 141/85 | HR 80

## 2023-07-09 VITALS — BP 120/70

## 2023-07-09 DIAGNOSIS — O99213 Obesity complicating pregnancy, third trimester: Secondary | ICD-10-CM | POA: Insufficient documentation

## 2023-07-09 DIAGNOSIS — O24419 Gestational diabetes mellitus in pregnancy, unspecified control: Secondary | ICD-10-CM | POA: Insufficient documentation

## 2023-07-09 DIAGNOSIS — Z3A35 35 weeks gestation of pregnancy: Secondary | ICD-10-CM | POA: Insufficient documentation

## 2023-07-09 DIAGNOSIS — O133 Gestational [pregnancy-induced] hypertension without significant proteinuria, third trimester: Secondary | ICD-10-CM | POA: Insufficient documentation

## 2023-07-09 DIAGNOSIS — O24415 Gestational diabetes mellitus in pregnancy, controlled by oral hypoglycemic drugs: Secondary | ICD-10-CM

## 2023-07-09 NOTE — Procedures (Signed)
[name] May 09, 1991 [redacted]w[redacted]d  Fetus A Non-Stress Test Interpretation for 07/09/23 (NST only)  Indication: Gestational Diabetes medication controlled and Obesity  Fetal Heart Rate A Mode: External Baseline Rate (A): 145 bpm Variability: Moderate Accelerations: 15 x 15 Decelerations: None Multiple birth?: No  Uterine Activity Mode: Palpation, Toco Contraction Frequency (min): None  Interpretation (Fetal Testing) Nonstress Test Interpretation: Reactive Comments: Dr. Parke Poisson reviewed tracing.

## 2023-07-12 ENCOUNTER — Telehealth (HOSPITAL_COMMUNITY): Payer: Self-pay | Admitting: *Deleted

## 2023-07-12 ENCOUNTER — Encounter (HOSPITAL_COMMUNITY): Payer: Self-pay

## 2023-07-12 NOTE — Patient Instructions (Signed)
[name]  07/12/2023   Your procedure is scheduled on:  07/23/2023  Arrive at 0530 at Entrance C on [address] at [facility]  and [employer]. You are invited to use the FREE valet parking or use the Visitor's parking deck.  Pick up the phone at the desk and dial [phone].  Call this number if you have problems the morning of surgery: [phone]  Remember:   Do not eat food:(After Midnight) Desps de medianoche.  Do not drink clear liquids: (After Midnight) Desps de medianoche.  Take these medicines the morning of surgery with A SIP OF WATER:  Valtrex    Do not wear jewelry, make-up or nail polish.  Do not wear lotions, powders, or perfumes. Do not wear deodorant.  Do not shave 48 hours prior to surgery.  Do not bring valuables to the hospital.  [facility] is not   responsible for any belongings or valuables brought to the hospital.  Contacts, dentures or bridgework may not be worn into surgery.  Leave suitcase in the car. After surgery it may be brought to your room.  For patients admitted to the hospital, checkout time is 11:00 AM the day of              discharge.      Please read over the following fact sheets that you were given:     Preparing for Surgery

## 2023-07-12 NOTE — Telephone Encounter (Signed)
Preadmission screen  

## 2023-07-13 ENCOUNTER — Encounter (HOSPITAL_COMMUNITY): Payer: Self-pay | Admitting: Obstetrics and Gynecology

## 2023-07-13 ENCOUNTER — Inpatient Hospital Stay (HOSPITAL_COMMUNITY)
Admission: AD | Admit: 2023-07-13 | Discharge: 2023-07-13 | Disposition: A | Discharge status: Home / Self Care | Payer: Medicaid Other | Attending: Obstetrics and Gynecology | Admitting: Obstetrics and Gynecology

## 2023-07-13 ENCOUNTER — Encounter (HOSPITAL_COMMUNITY): Payer: Self-pay

## 2023-07-13 DIAGNOSIS — O133 Gestational [pregnancy-induced] hypertension without significant proteinuria, third trimester: Secondary | ICD-10-CM | POA: Insufficient documentation

## 2023-07-13 DIAGNOSIS — R519 Headache, unspecified: Secondary | ICD-10-CM | POA: Diagnosis present

## 2023-07-13 DIAGNOSIS — R030 Elevated blood-pressure reading, without diagnosis of hypertension: Secondary | ICD-10-CM | POA: Diagnosis present

## 2023-07-13 DIAGNOSIS — O24419 Gestational diabetes mellitus in pregnancy, unspecified control: Secondary | ICD-10-CM | POA: Diagnosis not present

## 2023-07-13 DIAGNOSIS — Z3A35 35 weeks gestation of pregnancy: Secondary | ICD-10-CM | POA: Diagnosis not present

## 2023-07-13 DIAGNOSIS — O368130 Decreased fetal movements, third trimester, not applicable or unspecified: Secondary | ICD-10-CM | POA: Diagnosis present

## 2023-07-13 LAB — COMPREHENSIVE METABOLIC PANEL
ALT: 9 U/L (ref 0–44)
AST: 14 U/L — ABNORMAL LOW (ref 15–41)
Albumin: 2.7 g/dL — ABNORMAL LOW (ref 3.5–5.0)
Alkaline Phosphatase: 121 U/L (ref 38–126)
Anion gap: 10 (ref 5–15)
BUN: 9 mg/dL (ref 6–20)
CO2: 20 mmol/L — ABNORMAL LOW (ref 22–32)
Calcium: 8.8 mg/dL — ABNORMAL LOW (ref 8.9–10.3)
Chloride: 101 mmol/L (ref 98–111)
Creatinine, Ser: 0.51 mg/dL (ref 0.44–1.00)
GFR, Estimated: >60 mL/min (ref >60)
Glucose, Bld: 156 mg/dL — ABNORMAL HIGH (ref 70–99)
Potassium: 3.6 mmol/L (ref 3.5–5.1)
Sodium: 131 mmol/L — ABNORMAL LOW (ref 135–145)
Total Bilirubin: 0.5 mg/dL (ref 0.3–1.2)
Total Protein: 6.2 g/dL — ABNORMAL LOW (ref 6.5–8.1)

## 2023-07-13 LAB — PROTEIN / CREATININE RATIO, URINE
Creatinine, Urine: 121 mg/dL
Protein Creatinine Ratio: 0.16 mg/mg{Cre} — ABNORMAL HIGH (ref 0.00–0.15)
Total Protein, Urine: 19 mg/dL

## 2023-07-13 LAB — CBC
HCT: 36.6 % (ref 36.0–46.0)
Hemoglobin: 12.4 g/dL (ref 12.0–15.0)
MCH: 31 pg (ref 26.0–34.0)
MCHC: 33.9 g/dL (ref 30.0–36.0)
MCV: 91.5 fL (ref 80.0–100.0)
Platelets: 209 10*3/uL (ref 150–400)
RBC: 4 MIL/uL (ref 3.87–5.11)
RDW: 13.7 % (ref 11.5–15.5)
WBC: 9.8 10*3/uL (ref 4.0–10.5)
nRBC: 0 % (ref 0.0–0.2)

## 2023-07-13 MED ORDER — NIFEDIPINE ER 30 MG PO TB24: 30.0000 mg | ORAL_TABLET | Freq: Every day | ORAL | 0 refills | Status: AC

## 2023-07-13 MED ORDER — NIFEDIPINE ER OSMOTIC RELEASE 30 MG PO TB24
30.0000 mg | ORAL_TABLET | Freq: Once | ORAL | Status: AC
  Administered 2023-07-13: 30 mg via ORAL
  Filled 2023-07-13: qty 1

## 2023-07-13 NOTE — MAU Provider Note (Signed)
Chief Complaint: Headache, Hypertension, and Decreased Fetal Movement   Event Date/Time   First Provider Initiated Contact with Patient 07/13/23 1804      SUBJECTIVE HPI: [name] is a 32 y.o. [id number] at [redacted]w[redacted]d by LMP who presents to maternity admissions reporting headache, HTN, DFM. Pregnancy c/b A2GDM, tobacco use, hx C/S. Recently diagnosed with gHTN 8/23.  Noticed marked increase in lower extremity swelling today. Took her BP at work which was 180s/100s. Did not feel stressed or out of the usual. Has had a headache on and off today. Nothing currently. Felt "fuzzy". Denies RUQ pain, CP, SOB.  She denies vaginal bleeding, vaginal itching/burning, urinary symptoms, dizziness, n/v, or fever/chills.    HPI  Past Medical History:  Diagnosis Date   Anxiety    Depression    Gestational diabetes    Headache    Hidradenitis    HSV infection    Panic attacks    Pregnancy induced hypertension    Pseudotumor cerebri    Syncope    Trimalleolar fracture of ankle, closed 06/10/2017   right   Past Surgical History:  Procedure Laterality Date   CESAREAN SECTION N/A 11/09/2020   Procedure: CESAREAN SECTION;  Surgeon: Steva Ready, DO;  Location: [facility];  Service: Obstetrics;  Laterality: N/A;   LUMBAR PUNCTURE  09/09/2002; 10/14/2002; 05/11/2004, 02/2021   under anesthesia   ORIF ANKLE FRACTURE Right 06/15/2017   Procedure: OPEN REDUCTION INTERNAL FIXATION (ORIF)RIGHT TRIMALLEOLAR ANKLE FRACTURE WITHOUT POSTERIOR FIXATION;  Surgeon: Sheral Apley, MD;  Location: [facility];  Service: Orthopedics;  Laterality: Right;   Social History   Socioeconomic History   Marital status: Single    Spouse name: Not on file   Number of children: 0   Years of education: HS   Highest education level: Not on file  Occupational History    Employer: [employer]    Comment: [employer]  Tobacco Use   Smoking status: Every Day    Current packs/day: 1.00    Average packs/day: 1  pack/day for 7.0 years (7.0 ttl pk-yrs)    Types: Cigarettes   Smokeless tobacco: Never  Vaping Use   Vaping status: Never Used  Substance and Sexual Activity   Alcohol use: Not Currently    Comment: weekends   Drug use: Not Currently    Types: Marijuana   Sexual activity: Yes    Birth control/protection: None  Other Topics Concern   Not on file  Social History Narrative   Pt lives at home with her mother and step-dad.   Caffeine Use: 2-3 cups of caffeine daily   Social Determinants of Health   Financial Resource Strain: Not on file  Food Insecurity: No Food Insecurity (10/13/2020)   Hunger Vital Sign    Worried About Running Out of Food in the Last Year: Never true    Ran Out of Food in the Last Year: Never true  Transportation Needs: Not on file  Physical Activity: Not on file  Stress: Not on file  Social Connections: Not on file  Intimate Partner Violence: Not on file   No current facility-administered medications on file prior to encounter.   Current Outpatient Medications on File Prior to Encounter  Medication Sig Dispense Refill   metFORMIN (GLUCOPHAGE) 500 MG tablet Take 500 mg by mouth at bedtime.     Pediatric Multivit-Minerals (FLINTSTONES GUMMIES COMPLETE PO) Take 2 each by mouth daily.     valACYclovir (VALTREX) 500 MG tablet Take 500 mg by mouth  2 (two) times daily.     Allergies  Allergen Reactions   Amoxicillin Rash   Bactrim [Sulfamethoxazole-Trimethoprim] Rash    ROS:  Pertinent positives/negatives listed above.  I have reviewed patient's Past Medical Hx, Surgical Hx, Family Hx, Social Hx, medications and allergies.   Physical Exam  Patient Vitals for the past 24 hrs:  BP Temp Temp src Pulse Resp SpO2  07/13/23 1900 133/75 -- -- 89 -- --  07/13/23 1845 (!) 140/85 -- -- 85 -- --  07/13/23 1830 132/82 -- -- 83 -- --  07/13/23 1815 137/78 -- -- 79 -- --  07/13/23 1800 (!) 141/85 -- -- 83 -- --  07/13/23 1751 139/80 98.3 F (36.8 C) Oral 82 20 98  %   Constitutional: Well-developed, well-nourished female in no acute distress.  Cardiovascular: normal rate Respiratory: normal effort GI: Abd soft, non-tender MS: Extremities nontender, normal ROM. 4+ pitting edema b/l LE Neurologic: Alert and oriented x 4.   FHT:  Baseline 140 , moderate variability, accelerations present, no decelerations Contractions: irregular, uterine irritability  LAB RESULTS Results for orders placed or performed during the hospital encounter of 07/13/23 (from the past 24 hour(s))  Protein / creatinine ratio, urine     Status: Abnormal   Collection Time: 07/13/23  5:40 PM  Result Value Ref Range   Creatinine, Urine 121 mg/dL   Total Protein, Urine 19 mg/dL   Protein Creatinine Ratio 0.16 (H) 0.00 - 0.15 mg/mg[Cre]  Comprehensive metabolic panel     Status: Abnormal   Collection Time: 07/13/23  6:03 PM  Result Value Ref Range   Sodium 131 (L) 135 - 145 mmol/L   Potassium 3.6 3.5 - 5.1 mmol/L   Chloride 101 98 - 111 mmol/L   CO2 20 (L) 22 - 32 mmol/L   Glucose, Bld 156 (H) 70 - 99 mg/dL   BUN 9 6 - 20 mg/dL   Creatinine, Ser 1.61 0.44 - 1.00 mg/dL   Calcium 8.8 (L) 8.9 - 10.3 mg/dL   Total Protein 6.2 (L) 6.5 - 8.1 g/dL   Albumin 2.7 (L) 3.5 - 5.0 g/dL   AST 14 (L) 15 - 41 U/L   ALT 9 0 - 44 U/L   Alkaline Phosphatase 121 38 - 126 U/L   Total Bilirubin 0.5 0.3 - 1.2 mg/dL   GFR, Estimated >09 >60 mL/min   Anion gap 10 5 - 15  CBC     Status: None   Collection Time: 07/13/23  6:03 PM  Result Value Ref Range   WBC 9.8 4.0 - 10.5 K/uL   RBC 4.00 3.87 - 5.11 MIL/uL   Hemoglobin 12.4 12.0 - 15.0 g/dL   HCT 45.4 09.8 - 11.9 %   MCV 91.5 80.0 - 100.0 fL   MCH 31.0 26.0 - 34.0 pg   MCHC 33.9 30.0 - 36.0 g/dL   RDW 14.7 82.9 - 56.2 %   Platelets 209 150 - 400 K/uL   nRBC 0.0 0.0 - 0.2 %    B/Positive/-- (02/13 0000)  IMAGING [place] MFM FETAL BPP WO NON STRESS  Result Date:  06/19/2023 ----------------------------------------------------------------------  OBSTETRICS REPORT                       (Signed Final 06/19/2023 11:48 am) ---------------------------------------------------------------------- Patient Info  ID #:       [id number]  D.O.B.:  1991-04-24 (32 yrs)  Name:       [name]                 Visit Date: 06/19/2023 11:32 am ---------------------------------------------------------------------- Performed By  Attending:        Lin Landsman      Ref. Address:     [facility] [address]                    MD                                                             [address]                                                             [address]                                                             [place], [place]                                                             [address]  Performed By:     Dennis Bast RDMS      [facility]  Referred By:      Steva Ready ---------------------------------------------------------------------- Orders  #  Description                           Code        Ordered By  1  [place] MFM FETAL BPP WO NON               76819.01    BURK SCHAIBLE     STRESS  2  [place] MFM OB FOLLOW UP                   76816.01    [facility] ----------------------------------------------------------------------  #  Order #                     Accession #                Episode #  1  [id number]                   [id number]                 [id number]  2  [id number]                   [id number]                 [id number] ---------------------------------------------------------------------- Indications  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         O09.299  pregnancy, currently pregnant (2022)  Gestational  diabetes in pregnancy,             O24.415  controlled by oral hypoglycemic drugs  Poor obstetric history: Previous preterm       O09.219  delivery, antepartum ([redacted]w[redacted]d C/S)  Herpes simplex virus (HSV)                     O98.519 B00.9  Previous cesarean delivery, antepartum         O34.219  Obesity complicating pregnancy, third          O99.213  trimester (BMI 40)  LR NIPS - Female  [redacted] weeks gestation of pregnancy                Z3A.32 ---------------------------------------------------------------------- Vital Signs  BP:          117/75 ---------------------------------------------------------------------- Fetal Evaluation  Num Of Fetuses:         1  Fetal Heart Rate(bpm):  146  Cardiac Activity:       Observed  Presentation:           Cephalic  Placenta:               Anterior  P. Cord Insertion:      Visualized  Amniotic Fluid  AFI FV:      Within normal limits  AFI Sum(cm)     %Tile       Largest Pocket(cm)  19.77           75          6.26  RUQ(cm)       RLQ(cm)       LUQ(cm)        LLQ(cm)  6.26          2.81          5.87           4.83 ---------------------------------------------------------------------- Biophysical Evaluation  Amniotic F.V:   Within normal limits       F. Tone:        Observed  F. Movement:    Observed                   Score:  8/8  F. Breathing:   Observed ---------------------------------------------------------------------- Biometry  BPD:      79.9  mm     G. Age:  32w 1d         39  %    CI:        73.33   %    70 - 86                                                          FL/HC:      20.6   %    19.1 - 21.3  HC:      296.5  mm     G. Age:  32w 6d         29  %    HC/AC:      0.99        0.96 - 1.17  AC:      300.7  mm     G. Age:  34w 0d         93  %    FL/BPD:     76.6   %    71 - 87  FL:       61.2  mm     G. Age:  31w 5d         27  %    FL/AC:      20.4   %    20 - 24  LV:        5.3  mm  Est. FW:    2116  gm    4 lb 11 oz      70  %  ---------------------------------------------------------------------- OB History  Gravidity:    3         Term:   0        Prem:   1        SAB:   0  TOP:          1       Ectopic:  0        Living: 1 ---------------------------------------------------------------------- Gestational Age  LMP:           32w 1d        Date:  11/06/22                 EDD:   08/13/23  U/S Today:     32w 5d                                        EDD:   08/09/23  Best:          32w 1d     Det. By:  LMP  (11/06/22)          EDD:   08/13/23 ---------------------------------------------------------------------- Anatomy  Cranium:               Appears normal         Aortic Arch:            Previously seen  Cavum:                 Appears normal         Ductal Arch:  Previously seen  Ventricles:            Appears normal         Diaphragm:              Appears normal  Choroid Plexus:        Previously seen        Stomach:                Appears normal, left                                                                        sided  Cerebellum:            Previously seen        Abdomen:                Appears normal  Posterior Fossa:       Previously seen        Abdominal Wall:         Previously seen  Nuchal Fold:           Previously seen        Cord Vessels:           Previously seen  Face:                  Appears normal         Kidneys:                Appear normal                         (orbits and profile)  Lips:                  Previously seen        Bladder:                Appears normal  Thoracic:              Previously seen        Spine:                  Appears normal  Heart:                 Appears normal         Upper Extremities:      Previously seen                         (4CH, axis, and                         situs)  RVOT:                  Previously seen        Lower Extremities:      Previously seen  LVOT:                  Previously seen  Other:  Female gender, Left heel, both feet, open hands/5th digits, nasal  bone,          lenses, maxilla, mandible, falx, VC, 3VV, 3VTV previously  visualized ---------------------------------------------------------------------- Cervix Uterus Adnexa  Cervix  Not visualized (advanced GA >24wks)  Uterus  No abnormality visualized.  Right Ovary  Not visualized.  Left Ovary  Not visualized.  Cul De Sac  No free fluid seen.  Adnexa  No abnormality visualized ---------------------------------------------------------------------- Impression  Follow up growth due to A2GDM  Normal interval growth with measurements consistent with  dates  Good fetal movement and amniotic fluid volume  Biophysical profile 8/8 ---------------------------------------------------------------------- Recommendations  Follow up growth in 4 weeks  Continue weekly testing as previously scheduled. ----------------------------------------------------------------------              Lin Landsman, MD Electronically Signed Final Report   06/19/2023 11:48 am ----------------------------------------------------------------------   [place] MFM OB FOLLOW UP  Result Date: 06/19/2023 ----------------------------------------------------------------------  OBSTETRICS REPORT                       (Signed Final 06/19/2023 11:48 am) ---------------------------------------------------------------------- Patient Info  ID #:       [id number]                          D.O.B.:  [birth year] (32 yrs)  Name:       [name]                 Visit Date: 06/19/2023 11:32 am ---------------------------------------------------------------------- Performed By  Attending:        Lin Landsman      Ref. Address:     [facility]                    MD                                                             [address]                                                             [address]                                                             [place], [place]                                                             [address]  Performed By:      Dennis Bast RDMS      [facility]  Referred By:      Steva Ready ---------------------------------------------------------------------- Orders  #  Description                           Code        Ordered By  1  [place] MFM FETAL BPP WO NON               76819.01    BURK SCHAIBLE     STRESS  2  [place] MFM OB FOLLOW UP                   76816.01    [facility] ----------------------------------------------------------------------  #  Order #                     Accession #                Episode #  1  [id number]                   [id number]                 [id number]  2  [id number]                   [id number]                 [id number] ---------------------------------------------------------------------- Indications  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         O09.299  pregnancy, currently pregnant (2022)  Gestational diabetes in pregnancy,             O24.415  controlled by oral hypoglycemic drugs  Poor obstetric history: Previous preterm       O09.219  delivery, antepartum ([redacted]w[redacted]d C/S)  Herpes simplex virus (HSV)                     O98.519 B00.9  Previous cesarean delivery, antepartum         O34.219  Obesity complicating pregnancy, third          O99.213  trimester (BMI 40)  LR NIPS - Female  [redacted] weeks gestation of pregnancy                Z3A.32 ---------------------------------------------------------------------- Vital Signs  BP:          117/75 ---------------------------------------------------------------------- Fetal Evaluation  Num Of Fetuses:         1  Fetal Heart Rate(bpm):  146  Cardiac Activity:       Observed  Presentation:           Cephalic  Placenta:               Anterior  P. Cord Insertion:      Visualized  Amniotic Fluid  AFI FV:      Within normal limits  AFI  Sum(cm)     %Tile       Largest Pocket(cm)  19.77           75          6.26  RUQ(cm)       RLQ(cm)       LUQ(cm)        LLQ(cm)  6.26          2.81          5.87           4.83 ---------------------------------------------------------------------- Biophysical Evaluation  Amniotic F.V:   Within normal limits       F. Tone:        Observed  F. Movement:    Observed                   Score:          8/8  F. Breathing:   Observed ---------------------------------------------------------------------- Biometry  BPD:      79.9  mm     G. Age:  32w 1d         39  %    CI:        73.33   %    70 - 86                                                          FL/HC:      20.6   %    19.1 - 21.3  HC:      296.5  mm     G. Age:  32w 6d         29  %    HC/AC:      0.99        0.96 - 1.17  AC:      300.7  mm     G. Age:  34w 0d         93  %    FL/BPD:     76.6   %    71 - 87  FL:       61.2  mm     G. Age:  31w 5d         27  %    FL/AC:      20.4   %    20 - 24  LV:        5.3  mm  Est. FW:    2116  gm    4 lb 11 oz      70  % ---------------------------------------------------------------------- OB History  Gravidity:    3         Term:   0        Prem:   1        SAB:   0  TOP:          1       Ectopic:  0        Living: 1 ---------------------------------------------------------------------- Gestational Age  LMP:           32w 1d        Date:  11/06/22  EDD:   08/13/23  U/S Today:     32w 5d                                        EDD:   08/09/23  Best:          32w 1d     Det. By:  LMP  (11/06/22)          EDD:   08/13/23 ---------------------------------------------------------------------- Anatomy  Cranium:               Appears normal         Aortic Arch:            Previously seen  Cavum:                 Appears normal         Ductal Arch:            Previously seen  Ventricles:            Appears normal         Diaphragm:              Appears normal  Choroid Plexus:        Previously seen        Stomach:                 Appears normal, left                                                                        sided  Cerebellum:            Previously seen        Abdomen:                Appears normal  Posterior Fossa:       Previously seen        Abdominal Wall:         Previously seen  Nuchal Fold:           Previously seen        Cord Vessels:           Previously seen  Face:                  Appears normal         Kidneys:                Appear normal                         (orbits and profile)  Lips:                  Previously seen        Bladder:                Appears normal  Thoracic:              Previously seen        Spine:                  Appears normal  Heart:  Appears normal         Upper Extremities:      Previously seen                         (4CH, axis, and                         situs)  RVOT:                  Previously seen        Lower Extremities:      Previously seen  LVOT:                  Previously seen  Other:  Female gender, Left heel, both feet, open hands/5th digits, nasal bone,          lenses, maxilla, mandible, falx, VC, 3VV, 3VTV previously visualized ---------------------------------------------------------------------- Cervix Uterus Adnexa  Cervix  Not visualized (advanced GA >24wks)  Uterus  No abnormality visualized.  Right Ovary  Not visualized.  Left Ovary  Not visualized.  Cul De Sac  No free fluid seen.  Adnexa  No abnormality visualized ---------------------------------------------------------------------- Impression  Follow up growth due to A2GDM  Normal interval growth with measurements consistent with  dates  Good fetal movement and amniotic fluid volume  Biophysical profile 8/8 ---------------------------------------------------------------------- Recommendations  Follow up growth in 4 weeks  Continue weekly testing as previously scheduled. ----------------------------------------------------------------------              Lin Landsman, MD Electronically  Signed Final Report   06/19/2023 11:48 am ----------------------------------------------------------------------    MAU Management/MDM: Orders Placed This Encounter  Procedures   Comprehensive metabolic panel   CBC   Protein / creatinine ratio, urine    Meds ordered this encounter  Medications   NIFEdipine (PROCARDIA-XL/NIFEDICAL-XL) 24 hr tablet 30 mg   NIFEdipine (ADALAT CC) 30 MG 24 hr tablet    Sig: Take 1 tablet (30 mg total) by mouth daily.    Dispense:  30 tablet    Refill:  0    PreE work-up returned negative. BP continued to be in the mild range on serial Bps. Start Procardia 30 mg every day. First dose given here. PreE s/sx discussed and to return if any develop. Has c-section scheduled at 37 weeks for gHTN and A2GDM already. To follow-up with primary OB next week for BP check.  ASSESSMENT 1. Gestational hypertension, third trimester   2. [redacted] weeks gestation of pregnancy     PLAN Discharge home with strict return precautions. Allergies as of 07/13/2023       Reactions   Amoxicillin Rash   Bactrim [sulfamethoxazole-trimethoprim] Rash        Medication List     TAKE these medications    FLINTSTONES GUMMIES COMPLETE PO Take 2 each by mouth daily.   metFORMIN 500 MG tablet Commonly known as: GLUCOPHAGE Take 500 mg by mouth at bedtime.   NIFEdipine 30 MG 24 hr tablet Commonly known as: ADALAT CC Take 1 tablet (30 mg total) by mouth daily.   valACYclovir 500 MG tablet Commonly known as: VALTREX Take 500 mg by mouth 2 (two) times daily.         Wylene Simmer, MD OB Fellow 07/13/2023  7:15 PM

## 2023-07-13 NOTE — MAU Note (Signed)
..  [name] is a 32 y.o. at [redacted]w[redacted]d here in MAU reporting: called her provider to let her know that she had severe range Bps that were 180s/110, also had a headache earlier that has subsided. Denies VB or LOF. DFM today. Swelling in BLE.   Pain score: 0 Vitals:   07/13/23 1751  BP: 139/80  Pulse: 82  Resp: 20  Temp: 98.3 F (36.8 C)  SpO2: 98%     FHT:142

## 2023-07-17 DIAGNOSIS — O139 Gestational [pregnancy-induced] hypertension without significant proteinuria, unspecified trimester: Secondary | ICD-10-CM | POA: Insufficient documentation

## 2023-07-19 NOTE — H&P (Signed)
HPI: 32 y.o. [id number] @ [redacted]w[redacted]d estimated gestational age (as dated by LMP c/w 8 week ultrasound) presents for repeat cesarean section and bilateral salpingectomy due to history of cesarean section (desires repeat), gestational hypertension, and desire for permanent sterilization.   She was diagnosed with gestational hypertension on MAU encounter on 8/23 due to mild range blood pressures. Preeclampsia labs unremarkable and PCR 0.20. She had repeat labs performed on 07/13/23 and repeat PCR was 0.16 (performed on a separate MAU encounter). Has not been checking blood sugars regularly.  Patient has confirmed that she does not desire future fertility and 100% does not desire any more children.   Leakage of fluid:  No Vaginal bleeding:  No Contractions:  Yes - preterm contractions Fetal movement:  No  Prenatal care has been provided by Dr. Katrinka Blazing. Connye Burkitt [facility]).  ROS:  Denies fevers, chills, chest pain, visual changes, SOB, RUQ/epigastric pain, N/V, dysuria, hematuria, or sudden onset/worsening bilateral LE or facial edema.  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, Valtrex daily suppression) Obesity (BMI 40) Anxiety/depression (no medications, mood stable) Hidradenitis suppurativa Desires permanent sterilization  Prenatal Transfer Tool  Maternal Diabetes: Yes:  Diabetes Type:  Insulin/Medication controlled Genetic Screening: Normal - Low risk female, MSAFP negative Maternal Ultrasounds/Referrals: Normal Fetal  Ultrasounds or other Referrals:  Referred to Materal Fetal Medicine  Maternal Substance Abuse:  No Significant Maternal Medications:  None Significant Maternal Lab Results: Group B Strep negative   Prenatal Labs Blood type:  B Positive Antibody screen:  Negative CBC:  H/H 12.1/36.2 Rubella: Immune RPR:  Non-reactive Hep B:  Negative Hep C:  Negative HIV:  Negative GC/CT:  Negative Glucola:  Early glucola 152.9 3h GTT: Abnormal  Immunizations: Tdap: Given prenatally Flu: Declines  Contraceptive plan: Bilateral salpingectomy  OBHx:  OB History     Gravida  4   Para  2   Term      Preterm  2   AB  1   Living  1      SAB      IAB  1   Ectopic      Multiple  0   Live Births  1          PMHx:  See above Meds:  PNV, Metformin 500mg  at bedtime, ASA 81mg  daily Allergy:   Allergies  Allergen Reactions   Amoxicillin Rash   Bactrim [Sulfamethoxazole-Trimethoprim] Rash   SurgHx:  Past Surgical History:  Procedure Laterality Date   CESAREAN SECTION N/A 11/09/2020   Procedure: CESAREAN SECTION;  Surgeon: Steva Ready, DO;  Location: [facility];  Service: Obstetrics;  Laterality: N/A;   LUMBAR PUNCTURE  09/09/2002; 10/14/2002; 05/11/2004, 02/2021   under anesthesia   ORIF ANKLE FRACTURE Right 06/15/2017   Procedure: OPEN REDUCTION INTERNAL FIXATION (ORIF)RIGHT TRIMALLEOLAR ANKLE FRACTURE WITHOUT POSTERIOR FIXATION;  Surgeon: Sheral Apley, MD;  Location: [facility];  Service: Orthopedics;  Laterality: Right;   SocHx:   Denies Tobacco, ETOH, illicit drugs  O: LMP 11/06/2022  Gen. AAOx3, NAD CV.  Regular rate Resp. Normal work of  breathing Abd. Gravid, soft, non-tender throughout, no rebound/guarding Extr.  No bilateral LE edema, no calf tenderness bilaterally Speculum exam (8/27): No vulvar/vaginal/cervical lesions. No blood visualized in vaginal vault. Cervix appeared closed. -Pooling, - Ferning, - Nitrazine.  Last [place]:     Narrative & Impression    ----------------------------------------------------------------------  OBSTETRICS REPORT                       (Signed Final 06/19/2023 11:48 am) ---------------------------------------------------------------------- Patient Info    ID #:       [id number]                          D.O.B.:  [birth year] (32 yrs)  Name:       [name]                 Visit Date: 06/19/2023 11:32 am ---------------------------------------------------------------------- Performed By    Attending:        Lin Landsman      Ref. Address:     [facility]                    MD                                                             [address]                                                             [address]                                                             [place], [place]                                                             [address]  Performed By:     Dennis Bast RDMS      [facility]  Referred By:      Steva Ready ---------------------------------------------------------------------- Orders    #  Description                           Code        Ordered By  1  [place] MFM FETAL BPP WO NON               76819.01    BURK SCHAIBLE     STRESS  2  [place] MFM OB FOLLOW UP                   76816.01    [facility] ----------------------------------------------------------------------    #  Order #                     Accession #                Episode #  1  [id number]                   [id number]                 [id number]  2  [id number]                   [id number]                 [id number] ---------------------------------------------------------------------- Indications    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          O09.299  pregnancy, currently pregnant (2022)  Gestational diabetes in pregnancy,             O24.415  controlled by oral hypoglycemic drugs  Poor obstetric history: Previous preterm       O09.219  delivery, antepartum ([redacted]w[redacted]d C/S)  Herpes simplex virus (HSV)                     O98.519 B00.9  Previous cesarean delivery, antepartum         O34.219  Obesity complicating pregnancy, third          O99.213  trimester (BMI 40)  LR NIPS - Female  [redacted] weeks gestation of pregnancy                Z3A.32 ---------------------------------------------------------------------- Vital Signs    BP:          117/75 ---------------------------------------------------------------------- Fetal Evaluation    Num Of Fetuses:         1  Fetal Heart Rate(bpm):  146  Cardiac Activity:       Observed  Presentation:           Cephalic  Placenta:               Anterior  P. Cord Insertion:      Visualized  Amniotic Fluid  AFI FV:      Within normal limits    AFI Sum(cm)     %Tile       Largest Pocket(cm)  19.77  75          6.26    RUQ(cm)       RLQ(cm)       LUQ(cm)        LLQ(cm)  6.26          2.81          5.87           4.83 ---------------------------------------------------------------------- Biophysical Evaluation    Amniotic F.V:   Within normal limits       F. Tone:        Observed  F. Movement:    Observed                   Score:          8/8  F. Breathing:   Observed ---------------------------------------------------------------------- Biometry    BPD:      79.9  mm     G. Age:  32w 1d         39  %    CI:        73.33   %    70 - 86                                                          FL/HC:      20.6   %    19.1 - 21.3  HC:      296.5  mm     G. Age:  32w 6d         29  %    HC/AC:      0.99        0.96 - 1.17  AC:      300.7  mm     G. Age:  34w 0d         93  %    FL/BPD:     76.6   %    71 - 87  FL:       61.2  mm     G. Age:  31w 5d         27  %    FL/AC:       20.4   %    20 - 24  LV:        5.3  mm    Est. FW:    2116  gm    4 lb 11 oz      70  % ---------------------------------------------------------------------- OB History    Gravidity:    3         Term:   0        Prem:   1        SAB:   0  TOP:          1       Ectopic:  0        Living: 1 ---------------------------------------------------------------------- Gestational Age    LMP:           32w 1d        Date:  11/06/22                 EDD:   08/13/23  U/S Today:     32w 5d  EDD:   08/09/23  Best:          32w 1d     Det. By:  LMP  (11/06/22)          EDD:   08/13/23 ---------------------------------------------------------------------- Anatomy    Cranium:               Appears normal         Aortic Arch:            Previously seen  Cavum:                 Appears normal         Ductal Arch:            Previously seen  Ventricles:            Appears normal         Diaphragm:              Appears normal  Choroid Plexus:        Previously seen        Stomach:                Appears normal, left                                                                        sided  Cerebellum:            Previously seen        Abdomen:                Appears normal  Posterior Fossa:       Previously seen        Abdominal Wall:         Previously seen  Nuchal Fold:           Previously seen        Cord Vessels:           Previously seen  Face:                  Appears normal         Kidneys:                Appear normal                         (orbits and profile)  Lips:                  Previously seen        Bladder:                Appears normal  Thoracic:              Previously seen        Spine:                  Appears normal  Heart:                 Appears normal         Upper Extremities:      Previously seen                         (  4CH, axis, and                         situs)  RVOT:                  Previously seen        Lower Extremities:       Previously seen  LVOT:                  Previously seen    Other:  Female gender, Left heel, both feet, open hands/5th digits, nasal bone,          lenses, maxilla, mandible, falx, VC, 3VV, 3VTV previously visualized ---------------------------------------------------------------------- Cervix Uterus Adnexa    Cervix  Not visualized (advanced GA >24wks)    Uterus  No abnormality visualized.    Right Ovary  Not visualized.    Left Ovary  Not visualized.    Cul De Sac  No free fluid seen.    Adnexa  No abnormality visualized ---------------------------------------------------------------------- Impression    Follow up growth due to A2GDM  Normal interval growth with measurements consistent with  dates  Good fetal movement and amniotic fluid volume  Biophysical profile 8/8 ---------------------------------------------------------------------- Recommendations    Follow up growth in 4 weeks  Continue weekly testing as previously scheduled. ----------------------------------------------------------------------              Lin Landsman, MD     Last [place] [phone]): Narrative & Impression    ----------------------------------------------------------------------  OBSTETRICS REPORT                       (Signed Final 07/20/2023 02:30 pm) ---------------------------------------------------------------------- Patient Info    ID #:       [id number]                          D.O.B.:  [birth year] (32 yrs)  Name:       [name]                 Visit Date: 07/20/2023 07:55 am ---------------------------------------------------------------------- Performed By    Attending:        Noralee Space MD        Ref. Address:     [facility]                                                             [address]                                                             [address]                                                             [place], [place]  [address]  Performed By:     Isac Sarna        Location[facility]                    BS RDMS                                  [facility]  Referred By:      Steva Ready ---------------------------------------------------------------------- Orders    #  Description                           Code        Ordered By  1  [place] MFM OB FOLLOW UP                   60454.09    Lin Landsman  2  [place] MFM FETAL BPP WO NON               76819.01    [place]     STRESS                                            BOOKER ----------------------------------------------------------------------    #  Order #                     Accession #                Episode #  1  [id number]                   [id number]                 [id number]  2  [id number]                   [id number]                 [id number] ---------------------------------------------------------------------- Indications    Gestational diabetes in pregnancy,             O24.415  controlled by oral hypoglycemic drugs  (metformin)  Gestational hypertension without significant   O13.3  proteinuria, third trimester (Nifedipine)  History of neural  tube defect in prior         O09.299  pregnancy, currently pregnant (2022)  Poor obstetric history: Previous preterm       O09.219  delivery, antepartum ([redacted]w[redacted]d C/S)  Herpes simplex virus (HSV)                     O98.519 B00.9  Previous cesarean delivery, antepartum         O34.219  Obesity complicating pregnancy, third          O99.213  trimester (BMI 40)  LR NIPS - Female  [redacted] weeks gestation of pregnancy                Z3A.36 ---------------------------------------------------------------------- Fetal Evaluation    Num Of Fetuses:         1  Fetal  Heart Rate(bpm):  142  Cardiac Activity:       Observed  Presentation:           Cephalic  Placenta:               Anterior  P. Cord Insertion:      Previously seen  Amniotic Fluid  AFI FV:      Within normal limits    AFI Sum(cm)     %Tile       Largest Pocket(cm)  20.49           78          8.15    RUQ(cm)       RLQ(cm)       LUQ(cm)        LLQ(cm)  8.15          4.49          2.85           5 ---------------------------------------------------------------------- Biophysical Evaluation    Amniotic F.V:   Pocket => 2 cm             F. Tone:        Observed  F. Movement:    Observed                   Score:          8/8  F. Breathing:   Observed ---------------------------------------------------------------------- Biometry    BPD:      87.7  mm     G. Age:  35w 3d         30  %    CI:        73.78   %    70 - 86                                                          FL/HC:      20.5   %    20.8 - 22.6  HC:      324.3  mm     G. Age:  36w 5d         25  %    HC/AC:      0.91        0.92 - 1.05  AC:      356.6  mm     G. Age:  39w 4d       > 99  %  FL/BPD:     75.7   %    71 - 87  FL:       66.4  mm     G. Age:  34w 1d        4.2  %    FL/AC:      18.6   %    20 - 24    LV:        3.6  mm    Est. FW:    3246  gm      7 lb 2 oz     80  % ---------------------------------------------------------------------- OB History    Gravidity:    3         Term:   0        Prem:   1        SAB:   0  TOP:          1       Ectopic:  0        Living: 1 ---------------------------------------------------------------------- Gestational Age    LMP:           36w 4d        Date:  11/06/22                 EDD:   08/13/23  U/S Today:     36w 3d                                        EDD:   08/14/23  Best:          36w 4d     Det. By:  LMP  (11/06/22)          EDD:   08/13/23 ---------------------------------------------------------------------- Anatomy    Cranium:               Appears normal          Aortic Arch:            Previously seen  Cavum:                 Appears normal         Ductal Arch:            Previously seen  Ventricles:            Appears normal         Diaphragm:              Appears normal  Choroid Plexus:        Previously seen        Stomach:                Appears normal, left                                                                        sided  Cerebellum:            Previously seen        Abdomen:                Appears normal  Posterior Fossa:  Previously seen        Abdominal Wall:         Previously seen  Nuchal Fold:           Previously seen        Cord Vessels:           Previously seen  Face:                  Appears normal         Kidneys:                Appear normal                         (orbits and profile)  Lips:                  Previously seen        Bladder:                Appears normal  Thoracic:              Previously seen        Spine:                  Appears normal  Heart:                 Appears normal         Upper Extremities:      Previously seen                         (4CH, axis, and                         situs)  RVOT:                  Previously seen        Lower Extremities:      Previously seen  LVOT:                  Previously seen    Other:  Female gender, Left heel, both feet, open hands/5th digits, nasal bone,          lenses, maxilla, mandible, falx, VC, 3VV, 3VTV previously visualized ---------------------------------------------------------------------- Impression    Repeat CS Monday  UL hydrocele ---------------------------------------------------------------------- Recommendations    Gestational hypertension.  Blood pressure today at our office  is 130s/82 mmHg.  Patient will be delivering next week.  Gestational diabetes.  Patient takes metformin for control.    Amniotic fluid is normal good fetal activity seen.  Cephalic  presentation.  Antenatal testing is reassuring.  BPP 8/8.  Bilateral  hydroceles (right>left) are seen. Both testes look  normal with no evidence of torsion.    I reassured the patient that hydroceles usually resolve after  birth. ----------------------------------------------------------------------                 Noralee Space, MD Electronically Signed Final Report   07/20/2023 02:30 pm ----------------------------------------------------------------------    Labs: see orders  A/P:  32 y.o. [id number] @ [redacted]w[redacted]d who is admitted for  repeat cesarean section and bilateral salpingectomy due to history of cesarean section (desires repeat), gestational hypertension, and desire for permanent sterilization.   - Admit to L&D - Admit labs (CBC, T&S, RPR, preeclampsia labs) - CEFM/Toco - Diet:  NPO - IVF:  Per anesthesia - VTE Prophylaxis:  SCDs - GBS Status:  Negative - Presentation:  Cephalic by last [place]  - Antibiotics:  Ancef 2g on call to OR - Shohl's on call to OR - Consents signed and witnessed  Consents: I have explained to the patient that this surgery is performed to deliver their baby or babies through an incision in the abdomen and incision in the uterus.  Prior to surgery, the risks and benefits of the surgery, as well as alternative treatments were discussed.  The risks include, but are not limited to, possible need for cesarean delivery for all future pregnancies, bleeding at the time of surgery that could necessitate a blood transfusion and/or hysterectomy, rupture of the uterus during a future pregnancy that could cause a preterm delivery and/or requiring hysterectomy, infection, damage to surrounding organs and tissues, damage to bladder, damage to ureters, causing kidney damage, and requiring additional procedures, damage to bowels, resulting in further surgery, postoperative pain, short-term and long-term, scarring on the abdominal wall and intra-abdominally, need for further surgery, development of an incisional hernia, deep vein thrombosis and/or  pulmonary embolism, wound infection and/or separation, painful intercourse, urinary leakage, impact on future pregnancies including but not limited to, abnormal location or attachment of the placenta to the uterus, such as placenta previa or accreta, that may necessitate a blood transfusion and/or hysterectomy, impact on total family size, complications the course of which cannot be predicted or prevented, and death. Patient was consented for prophylactic bilateral salpingectomy as method of permanent sterilization.  She understands that this is a permanent procedure and is not intended to be reversed.  She was counseled on decreased future risk of ovarian cancer but understands that the risk of ovarian cancer is not entirely eliminated.  She understands that there is an increased risk of bleeding and increased surgical time as compared to bilateral tubal ligation. She understands the rare risk of failure and risk for ectopic pregnancy which is a life-threatening condition if she were to become pregnant in the future. Patient was consented for blood products.  The patient is aware that bleeding may result in the need for a blood transfusion which includes risk of transmission of HIV (1:2 million), Hepatitis C (1:2 million), and Hepatitis B (1:200 thousand) and transfusion reaction.  Patient voiced understanding of the above risks as well as understanding of indications for blood transfusion. Patient was consented for blood products.  The patient is aware that bleeding may result in the need for a blood transfusion which includes risk of transmission of HIV (1:2 million), Hepatitis C (1:2 million), and Hepatitis B (1:200 thousand) and transfusion reaction.  Patient voiced understanding of the above risks as well as understanding of indications for blood transfusion.   Steva Ready, DO

## 2023-07-20 ENCOUNTER — Ambulatory Visit: Payer: Medicaid Other | Admitting: *Deleted

## 2023-07-20 ENCOUNTER — Encounter (HOSPITAL_COMMUNITY): Payer: Self-pay | Admitting: Obstetrics and Gynecology

## 2023-07-20 ENCOUNTER — Ambulatory Visit: Payer: Medicaid Other | Attending: Maternal & Fetal Medicine

## 2023-07-20 ENCOUNTER — Other Ambulatory Visit: Payer: Self-pay | Admitting: Maternal & Fetal Medicine

## 2023-07-20 ENCOUNTER — Encounter (HOSPITAL_COMMUNITY)
Admission: RE | Admit: 2023-07-20 | Discharge: 2023-07-20 | Disposition: A | Discharge status: Home / Self Care | Payer: Medicaid Other | Source: Ambulatory Visit | Attending: Obstetrics and Gynecology | Admitting: Obstetrics and Gynecology

## 2023-07-20 VITALS — BP 130/82 | HR 93

## 2023-07-20 DIAGNOSIS — E669 Obesity, unspecified: Secondary | ICD-10-CM

## 2023-07-20 DIAGNOSIS — Z01812 Encounter for preprocedural laboratory examination: Secondary | ICD-10-CM | POA: Insufficient documentation

## 2023-07-20 DIAGNOSIS — O139 Gestational [pregnancy-induced] hypertension without significant proteinuria, unspecified trimester: Secondary | ICD-10-CM

## 2023-07-20 DIAGNOSIS — Z01818 Encounter for other preprocedural examination: Secondary | ICD-10-CM | POA: Diagnosis present

## 2023-07-20 DIAGNOSIS — O24415 Gestational diabetes mellitus in pregnancy, controlled by oral hypoglycemic drugs: Secondary | ICD-10-CM | POA: Diagnosis not present

## 2023-07-20 DIAGNOSIS — O34219 Maternal care for unspecified type scar from previous cesarean delivery: Secondary | ICD-10-CM

## 2023-07-20 DIAGNOSIS — O09213 Supervision of pregnancy with history of pre-term labor, third trimester: Secondary | ICD-10-CM

## 2023-07-20 DIAGNOSIS — O98513 Other viral diseases complicating pregnancy, third trimester: Secondary | ICD-10-CM

## 2023-07-20 DIAGNOSIS — O99213 Obesity complicating pregnancy, third trimester: Secondary | ICD-10-CM

## 2023-07-20 DIAGNOSIS — O09293 Supervision of pregnancy with other poor reproductive or obstetric history, third trimester: Secondary | ICD-10-CM

## 2023-07-20 DIAGNOSIS — O133 Gestational [pregnancy-induced] hypertension without significant proteinuria, third trimester: Secondary | ICD-10-CM

## 2023-07-20 DIAGNOSIS — Z3A36 36 weeks gestation of pregnancy: Secondary | ICD-10-CM

## 2023-07-20 DIAGNOSIS — B009 Herpesviral infection, unspecified: Secondary | ICD-10-CM

## 2023-07-20 DIAGNOSIS — O24419 Gestational diabetes mellitus in pregnancy, unspecified control: Secondary | ICD-10-CM | POA: Diagnosis present

## 2023-07-20 HISTORY — DX: Gestational diabetes mellitus in pregnancy, unspecified control: O24.419

## 2023-07-20 HISTORY — DX: Gestational (pregnancy-induced) hypertension without significant proteinuria, unspecified trimester: O13.9

## 2023-07-20 LAB — CBC
HCT: 37 % (ref 36.0–46.0)
Hemoglobin: 12.6 g/dL (ref 12.0–15.0)
MCH: 30.9 pg (ref 26.0–34.0)
MCHC: 34.1 g/dL (ref 30.0–36.0)
MCV: 90.7 fL (ref 80.0–100.0)
Platelets: 188 10*3/uL (ref 150–400)
RBC: 4.08 MIL/uL (ref 3.87–5.11)
RDW: 13.6 % (ref 11.5–15.5)
WBC: 9.3 10*3/uL (ref 4.0–10.5)
nRBC: 0 % (ref 0.0–0.2)

## 2023-07-20 LAB — TYPE AND SCREEN
ABO/RH(D): B POS
Antibody Screen: NEGATIVE

## 2023-07-20 NOTE — Anesthesia Preprocedure Evaluation (Signed)
Anesthesia Evaluation  Patient identified by MRN, date of birth, ID band Patient awake    Reviewed: Allergy & Precautions, NPO status , Patient's Chart, lab work & pertinent test results, reviewed documented beta blocker date and time   Airway Mallampati: II       Dental no notable dental hx. (+) Teeth Intact   Pulmonary Current Smoker and Patient abstained from smoking.   breath sounds clear to auscultation       Cardiovascular hypertension, Pt. on medications Normal cardiovascular exam Rhythm:Regular Rate:Normal     Neuro/Psych  Headaches PSYCHIATRIC DISORDERS Anxiety Depression    Psudotumor cerebri    GI/Hepatic Neg liver ROS,GERD  ,,  Endo/Other  diabetes, Well Controlled, Gestational, Oral Hypoglycemic Agents  Morbid obesity  Renal/GU negative Renal ROS  negative genitourinary   Musculoskeletal negative musculoskeletal ROS (+)    Abdominal  (+) + obese  Peds  Hematology negative hematology ROS (+)   Anesthesia Other Findings   Reproductive/Obstetrics (+) Pregnancy Previous C/Section                              Anesthesia Physical Anesthesia Plan  ASA: 3  Anesthesia Plan: Spinal   Post-op Pain Management: Minimal or no pain anticipated and Regional block*   Induction: Intravenous  PONV Risk Score and Plan: 3 and Treatment may vary due to age or medical condition  Airway Management Planned: Natural Airway  Additional Equipment: None and Fetal Monitoring  Intra-op Plan:   Post-operative Plan:   Informed Consent:   Plan Discussed with:   Anesthesia Plan Comments:        Anesthesia Quick Evaluation

## 2023-07-21 LAB — RPR: RPR Ser Ql: NONREACTIVE

## 2023-07-23 ENCOUNTER — Inpatient Hospital Stay (HOSPITAL_COMMUNITY)
Admission: AD | Admit: 2023-07-23 | Discharge: 2023-07-26 | DRG: 783 | Disposition: A | Discharge status: Home / Self Care | Payer: Medicaid Other | Attending: Obstetrics and Gynecology | Admitting: Obstetrics and Gynecology

## 2023-07-23 ENCOUNTER — Encounter (HOSPITAL_COMMUNITY)
Admission: AD | Disposition: A | Discharge status: Home / Self Care | Payer: Self-pay | Source: Home / Self Care | Attending: Obstetrics and Gynecology

## 2023-07-23 ENCOUNTER — Other Ambulatory Visit: Payer: Self-pay

## 2023-07-23 ENCOUNTER — Inpatient Hospital Stay (HOSPITAL_COMMUNITY): Payer: Medicaid Other | Admitting: Anesthesiology

## 2023-07-23 ENCOUNTER — Encounter (HOSPITAL_COMMUNITY): Payer: Self-pay | Admitting: Obstetrics and Gynecology

## 2023-07-23 DIAGNOSIS — L732 Hidradenitis suppurativa: Secondary | ICD-10-CM | POA: Diagnosis present

## 2023-07-23 DIAGNOSIS — O9832 Other infections with a predominantly sexual mode of transmission complicating childbirth: Secondary | ICD-10-CM | POA: Diagnosis present

## 2023-07-23 DIAGNOSIS — O9081 Anemia of the puerperium: Secondary | ICD-10-CM | POA: Diagnosis not present

## 2023-07-23 DIAGNOSIS — A6000 Herpesviral infection of urogenital system, unspecified: Secondary | ICD-10-CM | POA: Diagnosis present

## 2023-07-23 DIAGNOSIS — F32A Depression, unspecified: Secondary | ICD-10-CM | POA: Diagnosis present

## 2023-07-23 DIAGNOSIS — Z302 Encounter for sterilization: Secondary | ICD-10-CM

## 2023-07-23 DIAGNOSIS — O34219 Maternal care for unspecified type scar from previous cesarean delivery: Secondary | ICD-10-CM | POA: Diagnosis not present

## 2023-07-23 DIAGNOSIS — O34211 Maternal care for low transverse scar from previous cesarean delivery: Principal | ICD-10-CM | POA: Diagnosis present

## 2023-07-23 DIAGNOSIS — D62 Acute posthemorrhagic anemia: Secondary | ICD-10-CM | POA: Diagnosis not present

## 2023-07-23 DIAGNOSIS — Z3A37 37 weeks gestation of pregnancy: Secondary | ICD-10-CM

## 2023-07-23 DIAGNOSIS — O99214 Obesity complicating childbirth: Secondary | ICD-10-CM | POA: Diagnosis present

## 2023-07-23 DIAGNOSIS — O99354 Diseases of the nervous system complicating childbirth: Secondary | ICD-10-CM | POA: Diagnosis present

## 2023-07-23 DIAGNOSIS — O24425 Gestational diabetes mellitus in childbirth, controlled by oral hypoglycemic drugs: Secondary | ICD-10-CM | POA: Diagnosis present

## 2023-07-23 DIAGNOSIS — Z880 Allergy status to penicillin: Secondary | ICD-10-CM

## 2023-07-23 DIAGNOSIS — G932 Benign intracranial hypertension: Secondary | ICD-10-CM | POA: Diagnosis present

## 2023-07-23 DIAGNOSIS — O24424 Gestational diabetes mellitus in childbirth, insulin controlled: Secondary | ICD-10-CM | POA: Diagnosis not present

## 2023-07-23 DIAGNOSIS — O358XX0 Maternal care for other (suspected) fetal abnormality and damage, not applicable or unspecified: Secondary | ICD-10-CM | POA: Diagnosis present

## 2023-07-23 DIAGNOSIS — O9972 Diseases of the skin and subcutaneous tissue complicating childbirth: Secondary | ICD-10-CM | POA: Diagnosis present

## 2023-07-23 DIAGNOSIS — O134 Gestational [pregnancy-induced] hypertension without significant proteinuria, complicating childbirth: Secondary | ICD-10-CM | POA: Diagnosis present

## 2023-07-23 DIAGNOSIS — Z98891 History of uterine scar from previous surgery: Principal | ICD-10-CM

## 2023-07-23 LAB — CBC
HCT: 34.1 % — ABNORMAL LOW (ref 36.0–46.0)
Hemoglobin: 11.6 g/dL — ABNORMAL LOW (ref 12.0–15.0)
MCH: 30.5 pg (ref 26.0–34.0)
MCHC: 34 g/dL (ref 30.0–36.0)
MCV: 89.7 fL (ref 80.0–100.0)
Platelets: 192 10*3/uL (ref 150–400)
RBC: 3.8 MIL/uL — ABNORMAL LOW (ref 3.87–5.11)
RDW: 13.5 % (ref 11.5–15.5)
WBC: 13.6 10*3/uL — ABNORMAL HIGH (ref 4.0–10.5)
nRBC: 0 % (ref 0.0–0.2)

## 2023-07-23 LAB — COMPREHENSIVE METABOLIC PANEL
ALT: 13 U/L (ref 0–44)
AST: 15 U/L (ref 15–41)
Albumin: 2.5 g/dL — ABNORMAL LOW (ref 3.5–5.0)
Alkaline Phosphatase: 116 U/L (ref 38–126)
Anion gap: 13 (ref 5–15)
BUN: 10 mg/dL (ref 6–20)
CO2: 17 mmol/L — ABNORMAL LOW (ref 22–32)
Calcium: 8.3 mg/dL — ABNORMAL LOW (ref 8.9–10.3)
Chloride: 104 mmol/L (ref 98–111)
Creatinine, Ser: 0.56 mg/dL (ref 0.44–1.00)
GFR, Estimated: >60 mL/min (ref >60)
Glucose, Bld: 126 mg/dL — ABNORMAL HIGH (ref 70–99)
Potassium: 3.6 mmol/L (ref 3.5–5.1)
Sodium: 134 mmol/L — ABNORMAL LOW (ref 135–145)
Total Bilirubin: 0.3 mg/dL (ref 0.3–1.2)
Total Protein: 5.8 g/dL — ABNORMAL LOW (ref 6.5–8.1)

## 2023-07-23 LAB — PROTEIN / CREATININE RATIO, URINE
Creatinine, Urine: 88 mg/dL
Protein Creatinine Ratio: 1.69 mg/mg{creat} — ABNORMAL HIGH (ref 0.00–0.15)
Total Protein, Urine: 149 mg/dL

## 2023-07-23 LAB — CREATININE, SERUM
Creatinine, Ser: 0.57 mg/dL (ref 0.44–1.00)
GFR, Estimated: >60 mL/min (ref >60)

## 2023-07-23 LAB — GLUCOSE, CAPILLARY
Glucose-Capillary: 109 mg/dL — ABNORMAL HIGH (ref 70–99)
Glucose-Capillary: 109 mg/dL — ABNORMAL HIGH (ref 70–99)

## 2023-07-23 LAB — LACTATE DEHYDROGENASE: LDH: 158 U/L (ref 98–192)

## 2023-07-23 SURGERY — Surgical Case
Anesthesia: Spinal | Laterality: Bilateral

## 2023-07-23 MED ORDER — DIPHENHYDRAMINE HCL 50 MG/ML IJ SOLN: 12.5000 mg | INTRAMUSCULAR | Status: DC | PRN

## 2023-07-23 MED ORDER — NIFEDIPINE ER OSMOTIC RELEASE 30 MG PO TB24
30.0000 mg | ORAL_TABLET | Freq: Every day | ORAL | Status: DC
  Administered 2023-07-23 – 2023-07-26 (×4): 30 mg via ORAL
  Filled 2023-07-23 (×4): qty 1

## 2023-07-23 MED ORDER — BUPIVACAINE LIPOSOME 1.3 % IJ SUSP
INTRAMUSCULAR | Status: DC | PRN
  Administered 2023-07-23: 30 mL

## 2023-07-23 MED ORDER — LACTATED RINGERS IV SOLN: INTRAVENOUS | Status: DC

## 2023-07-23 MED ORDER — KETOROLAC TROMETHAMINE 30 MG/ML IJ SOLN: 30.0000 mg | Freq: Four times a day (QID) | INTRAMUSCULAR | Status: DC | PRN

## 2023-07-23 MED ORDER — ACETAMINOPHEN 10 MG/ML IV SOLN
INTRAVENOUS | Status: AC
  Filled 2023-07-23: qty 100

## 2023-07-23 MED ORDER — NALOXONE HCL 0.4 MG/ML IJ SOLN: 0.4000 mg | INTRAMUSCULAR | Status: DC | PRN

## 2023-07-23 MED ORDER — BUPIVACAINE HCL (PF) 0.5 % IJ SOLN
INTRAMUSCULAR | Status: AC
  Filled 2023-07-23: qty 30

## 2023-07-23 MED ORDER — SIMETHICONE 80 MG PO CHEW: 80.0000 mg | CHEWABLE_TABLET | ORAL | Status: DC | PRN

## 2023-07-23 MED ORDER — HYDRALAZINE HCL 20 MG/ML IJ SOLN: 10.0000 mg | INTRAMUSCULAR | Status: DC | PRN

## 2023-07-23 MED ORDER — FENTANYL CITRATE (PF) 100 MCG/2ML IJ SOLN
INTRAMUSCULAR | Status: DC | PRN
  Administered 2023-07-23: 15 ug via INTRATHECAL

## 2023-07-23 MED ORDER — SCOPOLAMINE 1 MG/3DAYS TD PT72
MEDICATED_PATCH | TRANSDERMAL | Status: AC
  Filled 2023-07-23: qty 1

## 2023-07-23 MED ORDER — BUPIVACAINE HCL (PF) 0.25 % IJ SOLN
INTRAMUSCULAR | Status: AC
  Filled 2023-07-23: qty 10

## 2023-07-23 MED ORDER — PRENATAL MULTIVITAMIN CH
1.0000 | ORAL_TABLET | Freq: Every day | ORAL | Status: DC
  Filled 2023-07-23 (×3): qty 1

## 2023-07-23 MED ORDER — FENTANYL CITRATE (PF) 100 MCG/2ML IJ SOLN
INTRAMUSCULAR | Status: AC
  Filled 2023-07-23: qty 2

## 2023-07-23 MED ORDER — IBUPROFEN 600 MG PO TABS
600.0000 mg | ORAL_TABLET | Freq: Four times a day (QID) | ORAL | Status: DC
  Administered 2023-07-24 – 2023-07-26 (×8): 600 mg via ORAL
  Filled 2023-07-23 (×8): qty 1

## 2023-07-23 MED ORDER — ACETAMINOPHEN 500 MG PO TABS
1000.0000 mg | ORAL_TABLET | Freq: Four times a day (QID) | ORAL | Status: DC
  Administered 2023-07-23 – 2023-07-26 (×11): 1000 mg via ORAL
  Filled 2023-07-23 (×11): qty 2

## 2023-07-23 MED ORDER — SIMETHICONE 80 MG PO CHEW
80.0000 mg | CHEWABLE_TABLET | Freq: Three times a day (TID) | ORAL | Status: DC
  Administered 2023-07-23 – 2023-07-26 (×8): 80 mg via ORAL
  Filled 2023-07-23 (×8): qty 1

## 2023-07-23 MED ORDER — KETOROLAC TROMETHAMINE 30 MG/ML IJ SOLN
30.0000 mg | Freq: Four times a day (QID) | INTRAMUSCULAR | Status: AC
  Administered 2023-07-23 – 2023-07-24 (×4): 30 mg via INTRAVENOUS
  Filled 2023-07-23 (×4): qty 1

## 2023-07-23 MED ORDER — ENOXAPARIN SODIUM 60 MG/0.6ML IJ SOSY
60.0000 mg | PREFILLED_SYRINGE | INTRAMUSCULAR | Status: DC
  Administered 2023-07-23 – 2023-07-26 (×3): 60 mg via SUBCUTANEOUS
  Filled 2023-07-23 (×3): qty 0.6

## 2023-07-23 MED ORDER — CEFAZOLIN SODIUM-DEXTROSE 2-4 GM/100ML-% IV SOLN
2.0000 g | INTRAVENOUS | Status: AC
  Administered 2023-07-23: 2 g via INTRAVENOUS

## 2023-07-23 MED ORDER — SENNOSIDES-DOCUSATE SODIUM 8.6-50 MG PO TABS
2.0000 | ORAL_TABLET | Freq: Every day | ORAL | Status: DC
  Administered 2023-07-24 – 2023-07-26 (×3): 2 via ORAL
  Filled 2023-07-23 (×3): qty 2

## 2023-07-23 MED ORDER — SODIUM CHLORIDE 0.9 % IR SOLN
Status: DC | PRN
  Administered 2023-07-23 (×2): 1

## 2023-07-23 MED ORDER — DIPHENHYDRAMINE HCL 25 MG PO CAPS: 25.0000 mg | ORAL_CAPSULE | Freq: Four times a day (QID) | ORAL | Status: DC | PRN

## 2023-07-23 MED ORDER — ONDANSETRON HCL 4 MG/2ML IJ SOLN
INTRAMUSCULAR | Status: AC
  Filled 2023-07-23: qty 2

## 2023-07-23 MED ORDER — SOD CITRATE-CITRIC ACID 500-334 MG/5ML PO SOLN
30.0000 mL | ORAL | Status: AC
  Administered 2023-07-23: 30 mL via ORAL

## 2023-07-23 MED ORDER — SOD CITRATE-CITRIC ACID 500-334 MG/5ML PO SOLN: 30.0000 mL | Freq: Once | ORAL | Status: DC

## 2023-07-23 MED ORDER — DIBUCAINE (PERIANAL) 1 % EX OINT: 1.0000 | TOPICAL_OINTMENT | CUTANEOUS | Status: DC | PRN

## 2023-07-23 MED ORDER — PHENYLEPHRINE HCL-NACL 20-0.9 MG/250ML-% IV SOLN
INTRAVENOUS | Status: DC | PRN
  Administered 2023-07-23: 60 ug/min via INTRAVENOUS

## 2023-07-23 MED ORDER — LABETALOL HCL 5 MG/ML IV SOLN: 40.0000 mg | INTRAVENOUS | Status: DC | PRN

## 2023-07-23 MED ORDER — SODIUM CHLORIDE 0.9% FLUSH: 3.0000 mL | INTRAVENOUS | Status: DC | PRN

## 2023-07-23 MED ORDER — BUPIVACAINE LIPOSOME 1.3 % IJ SUSP
INTRAMUSCULAR | Status: AC
  Filled 2023-07-23: qty 20

## 2023-07-23 MED ORDER — NALOXONE HCL 4 MG/10ML IJ SOLN: 1.0000 ug/kg/h | INTRAVENOUS | Status: DC | PRN

## 2023-07-23 MED ORDER — ONDANSETRON HCL 4 MG/2ML IJ SOLN
INTRAMUSCULAR | Status: DC | PRN
  Administered 2023-07-23: 4 mg via INTRAVENOUS

## 2023-07-23 MED ORDER — DEXAMETHASONE SODIUM PHOSPHATE 10 MG/ML IJ SOLN
INTRAMUSCULAR | Status: AC
  Filled 2023-07-23: qty 1

## 2023-07-23 MED ORDER — PHENYLEPHRINE 80 MCG/ML (10ML) SYRINGE FOR IV PUSH (FOR BLOOD PRESSURE SUPPORT)
PREFILLED_SYRINGE | INTRAVENOUS | Status: AC
  Filled 2023-07-23: qty 10

## 2023-07-23 MED ORDER — PHENYLEPHRINE HCL (PRESSORS) 10 MG/ML IV SOLN
INTRAVENOUS | Status: DC | PRN
  Administered 2023-07-23: 240 ug via INTRAVENOUS
  Administered 2023-07-23: 160 ug via INTRAVENOUS

## 2023-07-23 MED ORDER — BUPIVACAINE IN DEXTROSE 0.75-8.25 % IT SOLN
INTRATHECAL | Status: DC | PRN
  Administered 2023-07-23: 1.6 mL via INTRATHECAL

## 2023-07-23 MED ORDER — LABETALOL HCL 5 MG/ML IV SOLN: 20.0000 mg | INTRAVENOUS | Status: DC | PRN

## 2023-07-23 MED ORDER — METOCLOPRAMIDE HCL 5 MG/ML IJ SOLN
INTRAMUSCULAR | Status: AC
  Filled 2023-07-23: qty 2

## 2023-07-23 MED ORDER — ONDANSETRON HCL 4 MG/2ML IJ SOLN: 4.0000 mg | Freq: Three times a day (TID) | INTRAMUSCULAR | Status: DC | PRN

## 2023-07-23 MED ORDER — COCONUT OIL OIL: 1.0000 | TOPICAL_OIL | Status: DC | PRN

## 2023-07-23 MED ORDER — PHENYLEPHRINE HCL-NACL 20-0.9 MG/250ML-% IV SOLN
INTRAVENOUS | Status: AC
  Filled 2023-07-23: qty 250

## 2023-07-23 MED ORDER — OXYTOCIN-SODIUM CHLORIDE 30-0.9 UT/500ML-% IV SOLN
INTRAVENOUS | Status: DC | PRN
  Administered 2023-07-23: 300 mL via INTRAVENOUS

## 2023-07-23 MED ORDER — STERILE WATER FOR IRRIGATION IR SOLN
Status: DC | PRN
  Administered 2023-07-23: 1000 mL

## 2023-07-23 MED ORDER — OXYTOCIN-SODIUM CHLORIDE 30-0.9 UT/500ML-% IV SOLN: 2.5000 [IU]/h | INTRAVENOUS | Status: AC

## 2023-07-23 MED ORDER — WITCH HAZEL-GLYCERIN EX PADS: 1.0000 | MEDICATED_PAD | CUTANEOUS | Status: DC | PRN

## 2023-07-23 MED ORDER — ACETAMINOPHEN 10 MG/ML IV SOLN
INTRAVENOUS | Status: DC | PRN
  Administered 2023-07-23: 1000 mg via INTRAVENOUS

## 2023-07-23 MED ORDER — LABETALOL HCL 5 MG/ML IV SOLN: 80.0000 mg | INTRAVENOUS | Status: DC | PRN

## 2023-07-23 MED ORDER — DEXAMETHASONE SODIUM PHOSPHATE 10 MG/ML IJ SOLN
INTRAMUSCULAR | Status: DC | PRN
  Administered 2023-07-23: 10 mg via INTRAVENOUS

## 2023-07-23 MED ORDER — OXYTOCIN-SODIUM CHLORIDE 30-0.9 UT/500ML-% IV SOLN
INTRAVENOUS | Status: AC
  Filled 2023-07-23: qty 500

## 2023-07-23 MED ORDER — LACTATED RINGERS IV BOLUS
1000.0000 mL | Freq: Once | INTRAVENOUS | Status: AC
  Administered 2023-07-23: 1000 mL via INTRAVENOUS

## 2023-07-23 MED ORDER — MENTHOL 3 MG MT LOZG: 1.0000 | LOZENGE | OROMUCOSAL | Status: DC | PRN

## 2023-07-23 MED ORDER — DIPHENHYDRAMINE HCL 25 MG PO CAPS: 25.0000 mg | ORAL_CAPSULE | ORAL | Status: DC | PRN

## 2023-07-23 MED ORDER — OXYCODONE HCL 5 MG PO TABS
5.0000 mg | ORAL_TABLET | ORAL | Status: DC | PRN
  Administered 2023-07-24 (×2): 10 mg via ORAL
  Administered 2023-07-24: 5 mg via ORAL
  Administered 2023-07-25 – 2023-07-26 (×5): 10 mg via ORAL
  Filled 2023-07-23: qty 2
  Filled 2023-07-23 (×2): qty 1
  Filled 2023-07-23 (×2): qty 2
  Filled 2023-07-23: qty 1
  Filled 2023-07-23 (×3): qty 2

## 2023-07-23 MED ORDER — MORPHINE SULFATE (PF) 2 MG/ML IV SOLN: 1.0000 mg | INTRAVENOUS | Status: DC | PRN

## 2023-07-23 MED ORDER — CEFAZOLIN SODIUM-DEXTROSE 2-4 GM/100ML-% IV SOLN
INTRAVENOUS | Status: AC
  Filled 2023-07-23: qty 100

## 2023-07-23 MED ORDER — SCOPOLAMINE 1 MG/3DAYS TD PT72
1.0000 | MEDICATED_PATCH | TRANSDERMAL | Status: DC
  Administered 2023-07-23: 1.5 mg via TRANSDERMAL

## 2023-07-23 MED ORDER — METOCLOPRAMIDE HCL 5 MG/ML IJ SOLN
INTRAMUSCULAR | Status: DC | PRN
  Administered 2023-07-23: 5 mg via INTRAVENOUS

## 2023-07-23 MED ORDER — DEXAMETHASONE SODIUM PHOSPHATE 4 MG/ML IJ SOLN
INTRAMUSCULAR | Status: AC
  Filled 2023-07-23: qty 2

## 2023-07-23 MED ORDER — MORPHINE SULFATE (PF) 0.5 MG/ML IJ SOLN
INTRAMUSCULAR | Status: AC
  Filled 2023-07-23: qty 10

## 2023-07-23 MED ORDER — SOD CITRATE-CITRIC ACID 500-334 MG/5ML PO SOLN
ORAL | Status: AC
  Filled 2023-07-23: qty 30

## 2023-07-23 MED ORDER — MORPHINE SULFATE (PF) 0.5 MG/ML IJ SOLN
INTRAMUSCULAR | Status: DC | PRN
  Administered 2023-07-23: 150 ug via INTRATHECAL

## 2023-07-23 MED ORDER — SCOPOLAMINE 1 MG/3DAYS TD PT72: 1.0000 | MEDICATED_PATCH | TRANSDERMAL | Status: DC

## 2023-07-23 MED ORDER — ZOLPIDEM TARTRATE 5 MG PO TABS: 5.0000 mg | ORAL_TABLET | Freq: Every evening | ORAL | Status: DC | PRN

## 2023-07-23 MED ORDER — TRANEXAMIC ACID-NACL 1000-0.7 MG/100ML-% IV SOLN
INTRAVENOUS | Status: AC
  Filled 2023-07-23: qty 100

## 2023-07-23 SURGICAL SUPPLY — 37 items
APL PRP STRL LF DISP 70% ISPRP (MISCELLANEOUS) ×2
APL SKNCLS STERI-STRIP NONHPOA (GAUZE/BANDAGES/DRESSINGS) ×2
BENZOIN TINCTURE PRP APPL 2/3 (GAUZE/BANDAGES/DRESSINGS) ×1 IMPLANT
CHLORAPREP W/TINT 26 (MISCELLANEOUS) ×2 IMPLANT
CLAMP UMBILICAL CORD (MISCELLANEOUS) ×1 IMPLANT
CLOTH BEACON ORANGE TIMEOUT ST (SAFETY) ×1 IMPLANT
DRAPE C SECTION CLR SCREEN (DRAPES) ×1 IMPLANT
DRSG OPSITE POSTOP 4X10 (GAUZE/BANDAGES/DRESSINGS) ×1 IMPLANT
ELECT REM PT RETURN 9FT ADLT (ELECTROSURGICAL) ×1
ELECTRODE REM PT RTRN 9FT ADLT (ELECTROSURGICAL) ×1 IMPLANT
EXTRACTOR VACUUM KIWI (MISCELLANEOUS) IMPLANT
GAUZE SPONGE 4X4 12PLY STRL LF (GAUZE/BANDAGES/DRESSINGS) IMPLANT
GLOVE BIO SURGEON STRL SZ 6.5 (GLOVE) ×1 IMPLANT
GLOVE BIOGEL PI IND STRL 7.0 (GLOVE) ×2 IMPLANT
GLOVE SURG SS PI 6.5 STRL IVOR (GLOVE) ×1 IMPLANT
GOWN STRL REUS W/ TWL LRG LVL3 (GOWN DISPOSABLE) ×3 IMPLANT
GOWN STRL REUS W/TWL LRG LVL3 (GOWN DISPOSABLE) ×3
KIT ABG SYR 3ML LUER SLIP (SYRINGE) IMPLANT
LIGASURE IMPACT 36 18CM CVD LR (INSTRUMENTS) IMPLANT
NDL HYPO 25X5/8 SAFETYGLIDE (NEEDLE) IMPLANT
NEEDLE HYPO 25X5/8 SAFETYGLIDE (NEEDLE) IMPLANT
NS IRRIG 1000ML POUR BTL (IV SOLUTION) ×1 IMPLANT
PACK C SECTION WH (CUSTOM PROCEDURE TRAY) ×1 IMPLANT
PAD ABD DERMACEA PRESS 5X9 (GAUZE/BANDAGES/DRESSINGS) IMPLANT
PAD OB MATERNITY 4.3X12.25 (PERSONAL CARE ITEMS) ×1 IMPLANT
RTRCTR C-SECT PINK 25CM LRG (MISCELLANEOUS) ×1 IMPLANT
STRIP CLOSURE SKIN 1/2X4 (GAUZE/BANDAGES/DRESSINGS) ×1 IMPLANT
SUT MNCRL 0 VIOLET CTX 36 (SUTURE) ×2 IMPLANT
SUT MNCRL+ AB 3-0 CT1 36 (SUTURE) ×2 IMPLANT
SUT PDS AB 0 CTX 36 PDP370T (SUTURE) ×2 IMPLANT
SUT PLAIN 0 NONE (SUTURE) IMPLANT
SUT VIC AB 2-0 CT1 27 (SUTURE)
SUT VIC AB 2-0 CT1 TAPERPNT 27 (SUTURE) IMPLANT
SUT VIC AB 4-0 KS 27 (SUTURE) ×1 IMPLANT
TOWEL OR 17X24 6PK STRL BLUE (TOWEL DISPOSABLE) ×2 IMPLANT
TRAY FOLEY W/BAG SLVR 14FR LF (SET/KITS/TRAYS/PACK) ×1 IMPLANT
WATER STERILE IRR 1000ML POUR (IV SOLUTION) ×1 IMPLANT

## 2023-07-23 NOTE — Lactation Note (Addendum)
This note was copied from a baby's chart. Lactation Consultation Note  Patient Name: [name] [id number]'V Date: 07/23/2023 Age:31 hours Reason for consult: Initial assessment;Exclusive pumping and bottle feeding;Maternal endocrine disorder (See Birth Parent -MR: GDM.HTN/ C/S delivery,)   LC observed Birth Parent latched infant once and mostly been formula feeding infant. Birth Parent has decided that she wants to "Pump Only " and formula feed infant. LC set Birth Parent up with DEBP using 21 mm breast flange, Birth Parent will continue to pump every 3 hours for 15 minutes on initial setting and will offer infant any EBM 1st before formula. Birth Parent will continue to feed infant by cues, every 2-3 hours within 24 hours. Birth Parent knows to call RN/LC if their are any BF questions or concerns. Birth Parent knows that EBM is safe at room temperature for 4 hours whereas formula must be used within 1 hour once open. Birth Parent was made aware of O/P services, breastfeeding support groups, community resources, and our phone # for post-discharge questions. LC sent referral for STORK DEBP .   Maternal Data LC briefly latch 1st child maybe 2 days and pumped for 1 month.    Feeding Mother's Current Feeding Choice: Breast Milk and Formula Nipple Type: Nfant Standard Flow (white)  LATCH Score  "Pumping Only " and formula feeding infant.                   Lactation Tools Discussed/Used Tools: Flanges;Pump Flange Size: 21 Breast pump type: Double-Electric Breast Pump Pump Education: Setup, frequency, and cleaning;Milk Storage Reason for Pumping: Birth Parent decided only wants to Pump Only and formula feed infant. Pumping frequency: Birth Parent will continue to pump every 3 hours for 15 minutes.  Interventions Interventions: Breast feeding basics reviewed;DEBP;Education;Pace feeding;LC Services brochure;Guidelines for Milk Supply and Pumping Schedule Handout;CDC Guidelines for  Breast Pump Cleaning  Discharge Pump:  (LC sent referral for Stork DEBP tonight.)  Consult Status Consult Status: Follow-up Date: 07/24/23 Follow-up type: In-patient    Frederico Hamman 07/23/2023, 11:02 PM

## 2023-07-23 NOTE — Op Note (Addendum)
Pre Op Dx:   1. Single live IUP at [redacted]w[redacted]d 2. History of cesarean section (desires repeat) 3. Gestational Hypertension 4. Class A2 Diabetes 5. Desires permanent sterilization  Post Op Dx:  Same as pre-operative diagnoses  Procedure:   1. Repeat Low Transverse Cesarean Section 2. Bilateral salpingectomy  Surgeon:  Dr. Katrinka Blazing. Connye Burkitt Assistants:  Dr. Candelaria Celeste Anesthesia:  Spinal  EBL:  413cc  IVF:  1500cc UOP:  150cc  Drains:  Foley catheter  Specimen removed:  Placenta and bilateral fallopian tubes - sent to pathology Device(s) implanted:  None Case Type:  Clean-contaminated Findings: Normal-appearing uterus, bilateral fallopian tubes, and ovaries. Mild fascial adhesive disease. Adhesion of bladder to the right lower uterine segment. Fetus in cephalic position with clear amniotic fluid. APGAR: 8/9. Infant weight: 3210g (7lb 1.2oz). Complications: None Indications:  32 y.o. [id number] at [redacted]w[redacted]d with gestational HTN, A2 DM and desire for permanent sterilization. She had history of C/S x 1 and desired repeat.  Procedure:  After informed consent was obtained, the patient was brought to the operating room.  Following administration of spinal anesthesia, the patient was positioned in dorsal supine position with a leftward tilt and was prepped and draped in sterile fashion.  A preoperative time-out was performed.  The abdomen was entered in layers through a pfannenstiel incision and an Network engineer was placed. A bladder flap was created sharply.  A low transverse hysterotomy was created sharply to the level of the membranes, then extended bluntly.  The fetus was delivered from cephalic presentation onto the field.  Bulb suctioning was performed.  The cord was doubly clamped and cut after a 60 second pause.  The newborn was passed to the warmer.  The placenta was delivered.  The uterus was swept free of clots and debris and closed in a running locked fashion with 0-Monocryl. Hemostasis  was verified. The left fallopian tube was lifted with a Tanja Port was divided along the mesosalpinx to the level of the uterus. The tube was desiccated thoroughly and divided from the uterus using the Ligasure device. The specimen was removed. This process was completed on the contralateral side.  The abdomen was irrigated with warmed saline and cleared of clots. Subfascial spaces were inspected and hemostasis assured.  The fascia was closed in a running fashion with 0-PDS. A mixture of Exparel and 0.5% Bupivacaine was injected into the fascial and subcutaneous tissues. The subcutaneous tissues were irrigated and hemostasis assured.  The subcutaneous tissues were closed with 3-0 Monocryl.  The skin was closed with 4-0 Vicryl.  A sterile bandage was applied.  The patient was transferred to PACU.  All needle, sponge, and instrument counts were correct at the end of the case.    Disposition:  PACU  Comments: I performed the procedure and the assistant was needed due to the complexity of the anatomy. An experienced assistant was required given the standard of surgical care given the complexity of the case.  This assistant was needed for exposure, dissection, suctioning, retraction, instrument exchange, assisting with delivery with administration of fundal pressure, and for overall help during the procedure.    Steva Ready, DO

## 2023-07-23 NOTE — Anesthesia Postprocedure Evaluation (Signed)
Anesthesia Post Note  Patient: [name]  Procedure(s) Performed: REPEAT CESAREAN SECTION AND BILATERAL SALPINGECTOMY (Bilateral)     Patient location during evaluation: PACU Anesthesia Type: Spinal Level of consciousness: oriented and awake and alert Pain management: pain level controlled Vital Signs Assessment: post-procedure vital signs reviewed and stable Respiratory status: spontaneous breathing, respiratory function stable and nonlabored ventilation Cardiovascular status: blood pressure returned to baseline and stable Postop Assessment: no headache, no backache, no apparent nausea or vomiting and spinal receding Anesthetic complications: no   No notable events documented.  Last Vitals:  Vitals:   07/23/23 1000 07/23/23 1013  BP: 120/70 117/77  Pulse: 70 67  Resp: 19 20  Temp:  36.9 C  SpO2: 97% 98%    Last Pain:  Vitals:   07/23/23 0945  TempSrc: Oral  PainSc: 0-No pain   Pain Goal: Patients Stated Pain Goal: 5 (07/23/23 0915)  LLE Motor Response: Purposeful movement (07/23/23 1000) LLE Sensation: Tingling (07/23/23 1000) RLE Motor Response: Purposeful movement (07/23/23 1000) RLE Sensation: Tingling (07/23/23 1000) L Sensory Level: L5-Outer lower leg, top of foot, great toe (07/23/23 1000) R Sensory Level: L5-Outer lower leg, top of foot, great toe (07/23/23 1000) Epidural/Spinal Function Cutaneous sensation: Able to Wiggle Toes (07/23/23 0930), Patient able to flex knees: Yes (07/23/23 0930), Patient able to lift hips off bed: Yes (07/23/23 0930), Back pain beyond tenderness at insertion site: No (07/23/23 0930), Progressively worsening motor and/or sensory loss: No (07/23/23 0930), Bowel and/or bladder incontinence post epidural: No (07/23/23 0930)  Muad Noga A.

## 2023-07-23 NOTE — Transfer of Care (Signed)
Immediate Anesthesia Transfer of Care Note  Patient: [name]  Procedure(s) Performed: REPEAT CESAREAN SECTION AND BILATERAL SALPINGECTOMY (Bilateral)  Patient Location: PACU  Anesthesia Type:Spinal  Level of Consciousness: awake, alert , and oriented  Airway & Oxygen Therapy: Patient Spontanous Breathing  Post-op Assessment: Report given to RN and Post -op Vital signs reviewed and stable  Post vital signs: Reviewed and stable  Last Vitals:  Vitals Value Taken Time  BP 112/77 07/23/23 0911  Temp    Pulse 65 07/23/23 0913  Resp 16 07/23/23 0913  SpO2 98 % 07/23/23 0913  Vitals shown include unfiled device data.  Last Pain:  Vitals:   07/23/23 0633  TempSrc:   PainSc: 0-No pain         Complications: No notable events documented.

## 2023-07-23 NOTE — Interval H&P Note (Signed)
History and Physical Interval Note:  07/23/2023 7:23 AM  [name]  has presented today for surgery, with the diagnosis of Gestational hypertension. third trimester History of cesarean delivery Gestational diabetes mellitus in third trimester controlled on oral hypoglycemic drug and desires permanent sterilization.  The various methods of treatment have been discussed with the patient and family. After consideration of risks, benefits and other options for treatment, the patient has consented to  Procedure(s): REPEAT CESAREAN SECTION AND BILATERAL SALPINGECTOMY (Bilateral) as a surgical intervention.  The patient's history has been reviewed, patient examined, no change in status, stable for surgery.  I have reviewed the patient's chart and labs.  Questions were answered to the patient's satisfaction.     Steva Ready

## 2023-07-23 NOTE — Anesthesia Procedure Notes (Signed)
Spinal  Patient location during procedure: OR Start time: 07/23/2023 7:42 AM End time: 07/23/2023 7:46 AM Reason for block: surgical anesthesia Staffing Performed: anesthesiologist  Anesthesiologist: Mal Amabile, MD Performed by: Mal Amabile, MD Authorized by: Mal Amabile, MD   Preanesthetic Checklist Completed: patient identified, IV checked, site marked, risks and benefits discussed, surgical consent, monitors and equipment checked, pre-op evaluation and timeout performed Spinal Block Patient position: sitting Prep: DuraPrep and site prepped and draped Patient monitoring: heart rate, cardiac monitor, continuous pulse ox and blood pressure Approach: midline Location: L3-4 Injection technique: single-shot Needle Needle type: Pencan  Needle gauge: 24 G Needle length: 9 cm Needle insertion depth: 7 cm Assessment Sensory level: T4 Events: CSF return Additional Notes Patient tolerated procedure well. Adequate sensory level.

## 2023-07-24 LAB — CBC
HCT: 26.9 % — ABNORMAL LOW (ref 36.0–46.0)
Hemoglobin: 9.1 g/dL — ABNORMAL LOW (ref 12.0–15.0)
MCH: 31.7 pg (ref 26.0–34.0)
MCHC: 33.8 g/dL (ref 30.0–36.0)
MCV: 93.7 fL (ref 80.0–100.0)
Platelets: 158 10*3/uL (ref 150–400)
RBC: 2.87 MIL/uL — ABNORMAL LOW (ref 3.87–5.11)
RDW: 13.8 % (ref 11.5–15.5)
WBC: 8.2 10*3/uL (ref 4.0–10.5)
nRBC: 0 % (ref 0.0–0.2)

## 2023-07-24 LAB — GLUCOSE, CAPILLARY: Glucose-Capillary: 101 mg/dL — ABNORMAL HIGH (ref 70–99)

## 2023-07-24 MED ORDER — FERROUS SULFATE 325 (65 FE) MG PO TABS
325.0000 mg | ORAL_TABLET | ORAL | Status: DC
  Administered 2023-07-24 – 2023-07-26 (×2): 325 mg via ORAL
  Filled 2023-07-24 (×2): qty 1

## 2023-07-24 NOTE — Progress Notes (Signed)
Postpartum Note Day #1  S:  Patient doing well.  Pain controlled.  Tolerating regular diet. Foley just removed - has not voided yet. Denies fevers, chills, chest pain, SOB, N/V, or worsening bilateral LE edema.  Lochia: Minimal Infant feeding:  Bottle/formula Circumcision:  Desires prior to discharge Contraception:  S/p bilateral salpingectomy  O: Temp:  [97.7 F (36.5 C)-98.8 F (37.1 C)] 98 F (36.7 C) (09/10 0455) Pulse Rate:  [66-75] 75 (09/10 0455) Resp:  [14-20] 17 (09/10 0455) BP: (109-135)/(70-88) 111/77 (09/10 0455) SpO2:  [96 %-99 %] 98 % (09/10 0455) Gen: NAD, pleasant and cooperative CV: Regular rate Resp: Normal work of breathing Abdomen: soft, non-distended, non-tender throughout Uterus: firm, non-tender, below umbilicus Incision: c/d/i, pressure bandage in place  Ext: Trace bilateral LE edema, no bilateral calf tenderness  Labs:  Recent Labs    07/23/23 1249  HGB 11.6*  HCT 34.1*    A/P: Patient is a 32 y.o. Z6X0960 POD#1 s/p repeat LTCS/BS.  S/p LTCS - Pain well controlled  - GU: UOP is adequate - GI: Tolerating regular diet - Activity: encouraged sitting up to chair and ambulation as tolerated - DVT Prophylaxis: Ambulation, SCDs, Lovenox - Labs: CBC pending this AM  Gestational HTN - Normotensive Bps - Medication: Procardia XL 30mg  daily (started antepartum) - Preeclampsia labs unremarkable, PCR 1.69 (previously 0.16) - unclear if this is falsely elevated - Will arrange 1 week BP check outpatient  Class A2DM - Fasting BS 101 this AM  Anxiety/depression - Stable, no medications  Circumcision Consent:  Routine circumcisions performed on newborns have been identified as voluntary, elective procedures by [employer] such as the [employer] [facility].  It is considered an elective procedure with no definitive medical indication and carries risks. Risks include but are not limited to bleeding, infection, damage to penis with  possible need for further surgery, poor cosmesis, and local anesthetic risks. Circumcision will only be performed if patient is deemed to have normal anatomy by his Pediatrician, meets adequate criteria for a newborn of similar gestational age after birth and is without infection or other medical issue contraindicating an elective procedure. Patient understands and agrees. Patient discussed with mother of infant.   Disposition:  D/C home POD#2-3   Steva Ready, DO

## 2023-07-24 NOTE — Progress Notes (Signed)
[name] was referred for history of depression/anxiety.  * Referral screened out by Clinical Social Worker because none of the following criteria appear to apply:  ~ History of anxiety/depression during this pregnancy, or of post-partum depression following prior delivery.  ~ Diagnosis of anxiety and/or depression within last 3 years  Per OB notes, [name] did not indicate any signs/symptoms during pregnancy.  OR  * [name]'s symptoms currently being treated with medication and/or therapy.  Please contact the Clinical Social Worker if needs arise, by [name] request, or if [name] scores greater than 9/yes to question 10 on Edinburgh Postpartum Depression Screen.   Ashley Jones, LCSWA Clinical Social Worker [phone]  

## 2023-07-24 NOTE — Lactation Note (Addendum)
This note was copied from a baby's chart. Lactation Consultation Note  Patient Name: [name] Date: 07/24/2023 Age:31 hours  Reason for consult: Follow-up assessment;Breastfeeding assistance;Early term 37-38.6wks;Infant weight loss  P2, [redacted]w[redacted]d, ETI, 5% weight loss  Mother receptive to Lc visit. She reports she has attempted to latch baby, he falls asleep at the breast. She attempted to pump and did not express/ collect colostrum. She saw a scant amount on the flange. Infant has been formula feeding well. She says she would like to breastfeed her baby.  Basic breastfeeding education with baby in football hold  on right breast, baby Julie "[name]" latched well. Able to review hand expression and small amount of colostrum expressed prior to latch. Infant continuing to breast feed when LC exited the room.    Discussed the process of milk production, "supply and demand" and the importance of breast stimulation and milk removal in order to make an optimal milk supply.   Discussed mother to breastfeed 8-12 times in 24 hours, skin to skin and breast feed before formula feeding.    If missed feedings at breast or substituting feeding with formula, advised to hand express and/or pump to remove milk from the breast.   Mother verbalized understanding. Has breast pump at home and set up in the room. Due to gestational age and infrequently breastfeeding, mother will continue to supplement baby with formula.      LATCH Score Latch: Grasps breast easily, tongue down, lips flanged, rhythmical sucking.  Audible Swallowing: A few with stimulation  Type of Nipple: Everted at rest and after stimulation  Comfort (Breast/Nipple): Soft / non-tender  Hold (Positioning): Assistance needed to correctly position infant at breast and maintain latch.  LATCH Score: 8   Lactation Tools Discussed/Used Reason for Pumping: stimulate milk production Pumped volume:  (none with first  pumping)  Interventions Interventions: Breast feeding basics reviewed;Assisted with latch;Hand express;Breast compression;Adjust position;Support pillows;Education  Discharge Pump: DEBP;Stork Pump (Recieved Medela Max Flow pump Irena Cords))  Consult Status Consult Status: Follow-up Date: 07/25/23 Follow-up type: In-patient    Christella Hartigan M 07/24/2023, 2:16 PM

## 2023-07-25 ENCOUNTER — Other Ambulatory Visit (HOSPITAL_COMMUNITY): Payer: Self-pay

## 2023-07-25 MED ORDER — IBUPROFEN 600 MG PO TABS
600.0000 mg | ORAL_TABLET | Freq: Four times a day (QID) | ORAL | 1 refills | Status: AC
  Filled 2023-07-25: qty 30, 8d supply, fill #0

## 2023-07-25 MED ORDER — OXYCODONE HCL 5 MG PO TABS
5.0000 mg | ORAL_TABLET | Freq: Four times a day (QID) | ORAL | 0 refills | Status: AC | PRN
  Filled 2023-07-25: qty 30, 4d supply, fill #0

## 2023-07-25 MED ORDER — FERROUS SULFATE 325 (65 FE) MG PO TABS
325.0000 mg | ORAL_TABLET | Freq: Two times a day (BID) | ORAL | 3 refills | Status: AC
  Filled 2023-07-25: qty 100, 50d supply, fill #0

## 2023-07-25 MED ORDER — DOCUSATE SODIUM 100 MG PO CAPS
100.0000 mg | ORAL_CAPSULE | Freq: Two times a day (BID) | ORAL | 1 refills | Status: AC | PRN
  Filled 2023-07-25: qty 100, 50d supply, fill #0

## 2023-07-25 NOTE — Discharge Summary (Signed)
Postpartum Discharge Summary  Date of Service: 07/26/23     Patient Name: [name] DOB: January 05, 1991 MRN: [id number]  Date of admission: 07/23/2023 Delivery date:07/23/2023 Delivering provider: Steva Ready Date of discharge: 07/26/2023  Admitting diagnosis: History of cesarean delivery [Z98.891] Gestational HTN Class A2 DM  Fetal bilateral hydroceles  Hx fetus with ONTD (s/p termination in 2022 in late second trimester) Hx C/S x 1 Pseudotumor cerebri  HSV-2 Obesity (BMI 40) Anxiety/depression Hidradenitis suppurativa Desires permanent sterilization Intrauterine pregnancy: [redacted]w[redacted]d     Secondary diagnosis:  Principal Problem:   History of cesarean delivery And same as above Additional problems: None   Discharge diagnosis: Term Pregnancy Delivered, Gestational Hypertension, GDM A2, and History of cesarean section x 1                                               Post partum procedures: None Augmentation: N/A Complications: None  Hospital course: Sceduled C/S   32 y.o. yo [id number] at [redacted]w[redacted]d was admitted to the hospital 07/23/2023 for scheduled cesarean section with the following indication:Elective Repeat and Gestational HTN, A2DM .Delivery details are as follows:  Membrane Rupture Time/Date: 8:16 AM,07/23/2023  Delivery Method:C-Section, Low Transverse Operative Delivery:N/A Details of operation can be found in separate operative note.  Patient had a postpartum course complicated by nothing.  She is ambulating, tolerating a regular diet, passing flatus, and urinating well. Patient is discharged home in stable condition on  07/26/23        Newborn Data: Birth date:07/23/2023 Birth time:8:16 AM Gender:Female Living status:Living Apgars:8 ,9  Weight:3210 g    Magnesium Sulfate received: No BMZ received: No Rhophylac:N/A MMR:No T-DaP:Given prenatally Flu: No Transfusion:No  Physical exam  Vitals:   07/25/23 1455 07/25/23 2156 07/26/23 0347 07/26/23 0402  BP: 117/73 129/74  (!) 125/58   Pulse: 86 85 65   Resp: 17 18 17    Temp: 98.5 F (36.9 C) 97.9 F (36.6 C) 98 F (36.7 C)   TempSrc: Oral Oral  Oral  SpO2:   99%   Weight:      Height:       General: alert, cooperative, and no distress Lochia: appropriate Uterine Fundus: firm Incision: Healing well with no significant drainage, Dressing is clean, dry, and intact DVT Evaluation: No evidence of DVT seen on physical exam. No cords or calf tenderness. Labs: Lab Results  Component Value Date   WBC 8.2 07/24/2023   HGB 9.1 (L) 07/24/2023   HCT 26.9 (L) 07/24/2023   MCV 93.7 07/24/2023   PLT 158 07/24/2023      Latest Ref Rng & Units 07/23/2023   12:49 PM  CMP  Creatinine 0.44 - 1.00 mg/dL 7.84    Edinburgh Score:    07/23/2023   10:30 AM  Edinburgh Postnatal Depression Scale Screening Tool  I have been able to laugh and see the funny side of things. 0  I have looked forward with enjoyment to things. 0  I have blamed myself unnecessarily when things went wrong. 1  I have been anxious or worried for no good reason. 0  I have felt scared or panicky for no good reason. 0  Things have been getting on top of me. 0  I have been so unhappy that I have had difficulty sleeping. 0  I have felt sad or miserable. 1  I have been so unhappy  that I have been crying. 1  The thought of harming myself has occurred to me. 0  Edinburgh Postnatal Depression Scale Total 3      After visit meds:  Allergies as of 07/26/2023       Reactions   Amoxicillin Rash   Bactrim [sulfamethoxazole-trimethoprim] Rash        Medication List     STOP taking these medications    metFORMIN 500 MG tablet Commonly known as: GLUCOPHAGE   valACYclovir 500 MG tablet Commonly known as: VALTREX       TAKE these medications    docusate sodium 100 MG capsule Commonly known as: Colace Take 1 capsule (100 mg total) by mouth 2 (two) times daily as needed for moderate constipation or mild constipation.   FeroSul 325  (65 Fe) MG tablet Generic drug: ferrous sulfate Take 1 tablet (325 mg total) by mouth 2 (two) times daily with a meal.   FLINTSTONES GUMMIES COMPLETE PO Take 2 each by mouth daily.   ibuprofen 600 MG tablet Commonly known as: ADVIL Take 1 tablet (600 mg total) by mouth every 6 (six) hours.   NIFEdipine 30 MG 24 hr tablet Commonly known as: ADALAT CC Take 1 tablet (30 mg total) by mouth daily.   oxyCODONE 5 MG immediate release tablet Commonly known as: Oxy IR/ROXICODONE Take 1-2 tablets (5-10 mg total) by mouth every 6 (six) hours as needed for severe pain or breakthrough pain.         Discharge home in stable condition Infant Feeding: Bottle Infant Disposition:home with mother Discharge instruction: per After Visit Summary and Postpartum booklet. Activity: Advance as tolerated. Pelvic rest for 6 weeks.  Diet: routine diet Anticipated Birth Control:  S/p bilateral salpingectomy Postpartum Appointment:6 weeks Additional Postpartum F/U: Postpartum Depression checkup, Incision check 1 week and 2 weeks, and BP check 1 week Future Appointments:No future appointments. Follow up Visit:  Follow-up Information     Steva Ready, DO Follow up in 1 week(s).   Specialty: Obstetrics and Gynecology Why: Our office will arrange a 1 week blood pressure check, 2 week incision check and your 6 week postpartum visits. Contact information: [address] [place] [address] [phone]                     07/26/2023 Steva Ready, DO

## 2023-07-25 NOTE — Progress Notes (Addendum)
Postpartum Note Day #2  S:  Patient doing well. Reports incisional pain and feels she would benefit from staying another day. Tolerating regular diet. Ambulating and voiding without issue. Denies fevers, chills, chest pain, SOB, N/V, or worsening bilateral LE edema.  Lochia: Minimal Infant feeding:  Bottle/formula Circumcision:  Desires prior to discharge Contraception:  S/p bilateral salpingectomy  O: Temp:  [97.7 F (36.5 C)-98.2 F (36.8 C)] 97.7 F (36.5 C) (09/11 0316) Pulse Rate:  [83-92] 83 (09/11 0316) Resp:  [17] 17 (09/11 0316) BP: (117-131)/(68-72) 117/68 (09/11 0316) SpO2:  [98 %-99 %] 99 % (09/11 0316) Gen: NAD, pleasant and cooperative CV: Regular rate Resp: Normal work of breathing Abdomen: soft, non-distended, non-tender throughout Uterus: firm, non-tender, below umbilicus Incision: c/d/i, pressure bandage in place  Ext: Trace bilateral LE edema, no bilateral calf tenderness  Labs:  Recent Labs    07/23/23 1249 07/24/23 0724  HGB 11.6* 9.1*  HCT 34.1* 26.9*    A/P: Patient is a 32 y.o. Z6X0960 POD#2 s/p repeat LTCS/BS.  S/p LTCS - Pain well controlled  - GU: UOP is adequate - GI: Tolerating regular diet - Activity: encouraged sitting up to chair and ambulation as tolerated - DVT Prophylaxis: Ambulation, SCDs, Lovenox - Labs: as above - oral iron ordered  Gestational HTN - Normotensive Bps - Medication: Procardia XL 30mg  daily (started antepartum) - Preeclampsia labs unremarkable, PCR 1.69 (previously 0.16) - unclear if this is falsely elevated - Will arrange 1 week BP check outpatient  Class A2DM - Fasting BS 101 postpartum  Anxiety/depression - Stable, no medications  Disposition:  D/C home tomorrow.   Steva Ready, DO

## 2023-07-26 LAB — SURGICAL PATHOLOGY

## 2023-07-26 NOTE — Lactation Note (Signed)
This note was copied from a baby's chart. Lactation Consultation Note RN asked LC to see mom d/t fullness in breast. Mom has had a pumping session and still feels full. LC going to another rm. Suggested mom lay flat and apply Ice 20 minutes then pump again then lay flat again and rest. When LC got to see mom, mom was sleeping soundly laying.  Patient Name: [name] [id number] Date: 07/26/2023 Age:31 hours     Maternal Data    Feeding    LATCH Score                    Lactation Tools Discussed/Used    Interventions    Discharge    Consult Status      Charyl Dancer 07/26/2023, 1:41 AM

## 2023-08-04 ENCOUNTER — Inpatient Hospital Stay (HOSPITAL_COMMUNITY)
Admission: AD | Admit: 2023-08-04 | Discharge: 2023-08-04 | Disposition: A | Discharge status: Home / Self Care | Payer: Medicaid Other | Attending: Obstetrics and Gynecology | Admitting: Obstetrics and Gynecology

## 2023-08-04 DIAGNOSIS — Z5189 Encounter for other specified aftercare: Secondary | ICD-10-CM

## 2023-08-04 DIAGNOSIS — Z711 Person with feared health complaint in whom no diagnosis is made: Secondary | ICD-10-CM | POA: Diagnosis present

## 2023-08-04 DIAGNOSIS — O900 Disruption of cesarean delivery wound: Secondary | ICD-10-CM | POA: Diagnosis present

## 2023-08-04 NOTE — MAU Provider Note (Signed)
[name] is a 32 y.o. female [id number] status post repeat C/s on 07/23/23 with bilateral salpingectomy; she is here for an incision check. She has her honeycomb dressing and steri strips in place. She reports seeing fluid under the dressing and felt nervous to remove it.   Vitals:   08/04/23 1618  BP: 138/77  Pulse: 82  Resp: 18  Temp: 98.9 F (37.2 C)  TempSrc: Oral  SpO2: 99%     GENERAL: Well-developed, well-nourished female in no acute distress.  LUNGS: Effort normal SKIN: Warm, dry and without erythema PSYCH: Normal mood and affect  Abdomen: Honeycomb dress along with steri strips removed with ease. Incision is well approximated, no drainage, odor or erythema. Patient was shown a picture of her incision and feels relief.    1. Visit for wound check   2. Postpartum state      P:  Dc home Keep your f/u PP visit Return to MAU if symptoms worsen Keep incision clean and dry  Tavaria Mackins, Harolyn Rutherford, NP 08/04/2023 7:42 PM

## 2023-08-04 NOTE — MAU Note (Signed)
[name] is a 32 y.o. at Unknown here in MAU reporting: c/s on the 9th.  Removed original honeycomb dressing prior to d/c.  Noted yesterday, there is blood part way up the dressing- is afraid to remove it.  Afraid it has opened up.  Baby is doing well, is bottle feeding. Vag bleeding is minimal, changing once a day.   Onset of complaint: yesterday  Pain score: ok right now Vitals:   08/04/23 1618  BP: 138/77  Pulse: 82  Resp: 18  Temp: 98.9 F (37.2 C)  SpO2: 99%      Lab orders placed from triage:

## 2023-08-22 ENCOUNTER — Telehealth (HOSPITAL_COMMUNITY): Payer: Self-pay | Admitting: *Deleted

## 2023-08-22 NOTE — Telephone Encounter (Signed)
08/22/2023  Name: [name] MRN: [id number] DOB: 12/25/1990  Reason for Call:  Transition of Care Hospital Discharge Call  Contact Status: Patient Contact Status: Message  Language assistant needed:          Follow-Up Questions:    Inocente Salles Postnatal Depression Scale:  In the Past 7 Days:    PHQ2-9 Depression Scale:     Discharge Follow-up:    Post-discharge interventions: NA  Salena Saner, RN 08/22/2023 16:41
